# Patient Record
Sex: Female | Born: 1965
Health system: Southern US, Community
[De-identification: ages and names within clinical notes are randomized; demographics above are authoritative.]

## PROBLEM LIST (undated history)

## (undated) DIAGNOSIS — M79605 Pain in left leg: Secondary | ICD-10-CM

## (undated) DIAGNOSIS — K279 Peptic ulcer, site unspecified, unspecified as acute or chronic, without hemorrhage or perforation: Secondary | ICD-10-CM

## (undated) DIAGNOSIS — H539 Unspecified visual disturbance: Secondary | ICD-10-CM

## (undated) DIAGNOSIS — R030 Elevated blood-pressure reading, without diagnosis of hypertension: Secondary | ICD-10-CM

## (undated) DIAGNOSIS — D649 Anemia, unspecified: Secondary | ICD-10-CM

## (undated) DIAGNOSIS — E213 Hyperparathyroidism, unspecified: Secondary | ICD-10-CM

## (undated) DIAGNOSIS — I10 Essential (primary) hypertension: Secondary | ICD-10-CM

## (undated) DIAGNOSIS — F419 Anxiety disorder, unspecified: Secondary | ICD-10-CM

## (undated) DIAGNOSIS — T148XXA Other injury of unspecified body region, initial encounter: Secondary | ICD-10-CM

## (undated) DIAGNOSIS — M8580 Other specified disorders of bone density and structure, unspecified site: Secondary | ICD-10-CM

## (undated) HISTORY — DX: Essential (primary) hypertension: I10

## (undated) HISTORY — DX: Peptic ulcer, site unspecified, unspecified as acute or chronic, without hemorrhage or perforation: K27.9

## (undated) HISTORY — DX: Unspecified visual disturbance: H53.9

---

## 2006-02-15 DIAGNOSIS — I10 Essential (primary) hypertension: Secondary | ICD-10-CM

## 2006-02-15 HISTORY — DX: Essential (primary) hypertension: I10

## 2006-02-15 HISTORY — PX: GASTRIC BYPASS: SHX52

## 2016-01-11 ENCOUNTER — Encounter (HOSPITAL_COMMUNITY): Payer: Self-pay | Admitting: Emergency Medicine

## 2016-01-11 ENCOUNTER — Emergency Department (HOSPITAL_COMMUNITY): Payer: BLUE CROSS/BLUE SHIELD

## 2016-01-11 ENCOUNTER — Emergency Department (HOSPITAL_COMMUNITY)
Admission: EM | Admit: 2016-01-11 | Discharge: 2016-01-11 | Disposition: A | Discharge status: Home / Self Care | Payer: BLUE CROSS/BLUE SHIELD | Attending: Emergency Medicine | Admitting: Emergency Medicine

## 2016-01-11 DIAGNOSIS — S59901A Unspecified injury of right elbow, initial encounter: Secondary | ICD-10-CM | POA: Diagnosis present

## 2016-01-11 DIAGNOSIS — S8012XA Contusion of left lower leg, initial encounter: Secondary | ICD-10-CM | POA: Diagnosis not present

## 2016-01-11 DIAGNOSIS — R51 Headache: Secondary | ICD-10-CM | POA: Diagnosis not present

## 2016-01-11 DIAGNOSIS — Y9289 Other specified places as the place of occurrence of the external cause: Secondary | ICD-10-CM | POA: Insufficient documentation

## 2016-01-11 DIAGNOSIS — S8001XA Contusion of right knee, initial encounter: Secondary | ICD-10-CM | POA: Diagnosis not present

## 2016-01-11 DIAGNOSIS — S50311A Abrasion of right elbow, initial encounter: Secondary | ICD-10-CM | POA: Diagnosis not present

## 2016-01-11 DIAGNOSIS — Y9352 Activity, horseback riding: Secondary | ICD-10-CM | POA: Diagnosis not present

## 2016-01-11 DIAGNOSIS — Y999 Unspecified external cause status: Secondary | ICD-10-CM | POA: Insufficient documentation

## 2016-01-11 DIAGNOSIS — R52 Pain, unspecified: Secondary | ICD-10-CM

## 2016-01-11 DIAGNOSIS — T07XXXA Unspecified multiple injuries, initial encounter: Secondary | ICD-10-CM

## 2016-01-11 HISTORY — DX: Anxiety disorder, unspecified: F41.9

## 2016-01-11 LAB — COMPREHENSIVE METABOLIC PANEL
ALT: 21 U/L (ref 14–54)
ANION GAP: 7 (ref 5–15)
AST: 22 U/L (ref 15–41)
Albumin: 3.4 g/dL — ABNORMAL LOW (ref 3.5–5.0)
Alkaline Phosphatase: 100 U/L (ref 38–126)
BUN: 7 mg/dL (ref 6–20)
CO2: 21 mmol/L — AB (ref 22–32)
Calcium: 10.2 mg/dL (ref 8.9–10.3)
Chloride: 107 mmol/L (ref 101–111)
Creatinine, Ser: 0.64 mg/dL (ref 0.44–1.00)
GFR calc Af Amer: >60 mL/min (ref >60)
GFR calc non Af Amer: >60 mL/min (ref >60)
GLUCOSE: 97 mg/dL (ref 65–99)
POTASSIUM: 3.8 mmol/L (ref 3.5–5.1)
SODIUM: 135 mmol/L (ref 135–145)
Total Bilirubin: 0.2 mg/dL — ABNORMAL LOW (ref 0.3–1.2)
Total Protein: 6.5 g/dL (ref 6.5–8.1)

## 2016-01-11 LAB — CBC WITH DIFFERENTIAL/PLATELET
BASOS ABS: 0 10*3/uL (ref 0.0–0.1)
BASOS PCT: 0 %
EOS ABS: 0.1 10*3/uL (ref 0.0–0.7)
Eosinophils Relative: 1 %
HCT: 36.7 % (ref 36.0–46.0)
HEMOGLOBIN: 12 g/dL (ref 12.0–15.0)
Lymphocytes Relative: 33 %
Lymphs Abs: 1.6 10*3/uL (ref 0.7–4.0)
MCH: 27.3 pg (ref 26.0–34.0)
MCHC: 32.7 g/dL (ref 30.0–36.0)
MCV: 83.6 fL (ref 78.0–100.0)
MONO ABS: 0.5 10*3/uL (ref 0.1–1.0)
MONOS PCT: 11 %
NEUTROS PCT: 55 %
Neutro Abs: 2.7 10*3/uL (ref 1.7–7.7)
Platelets: 201 10*3/uL (ref 150–400)
RBC: 4.39 MIL/uL (ref 3.87–5.11)
RDW: 13.4 % (ref 11.5–15.5)
WBC: 4.9 10*3/uL (ref 4.0–10.5)

## 2016-01-11 LAB — I-STAT BETA HCG BLOOD, ED (MC, WL, AP ONLY): I-stat hCG, quantitative: <5 m[IU]/mL (ref <5)

## 2016-01-11 MED ORDER — ONDANSETRON 4 MG PO TBDP
4.0000 mg | ORAL_TABLET | Freq: Once | ORAL | Status: AC
  Administered 2016-01-11: 4 mg via ORAL
  Filled 2016-01-11: qty 1

## 2016-01-11 MED ORDER — ONDANSETRON 4 MG PO TBDP: 4.0000 mg | ORAL_TABLET | Freq: Three times a day (TID) | ORAL | 0 refills | Status: DC | PRN

## 2016-01-11 MED ORDER — HYDROMORPHONE HCL 2 MG/ML IJ SOLN
1.0000 mg | Freq: Once | INTRAMUSCULAR | Status: AC
  Administered 2016-01-11: 1 mg via INTRAVENOUS
  Filled 2016-01-11: qty 1

## 2016-01-11 MED ORDER — OXYCODONE-ACETAMINOPHEN 5-325 MG PO TABS: 1.0000 | ORAL_TABLET | ORAL | 0 refills | Status: DC | PRN

## 2016-01-11 NOTE — Discharge Instructions (Signed)
Take the prescribed medication as directed.  Do not drive while taking this, it can make you sleepy/drowsy. You may be sore for a while with your injuries.  Can wear the sling to help with right elbow pain. Make sure to follow-up with your neurosurgeon about your brain cyst.   Follow-up with your primary care doctor. Return to the ED for new or worsening symptoms.

## 2016-01-11 NOTE — ED Notes (Signed)
Pt to radiology.

## 2016-01-11 NOTE — ED Provider Notes (Signed)
[facility] DEPT Provider Note   CSN: [id number] Arrival date & time: 01/11/16  1209     History   Chief Complaint Chief Complaint  Patient presents with  . Fall    HPI Cynthia Hoover is a 50 y.o. female.  The history is provided by the patient and medical records.   50 year old female with history of anxiety, brain cyst under close surveillance, presenting to the ED after a fall from a horse yesterday. Patient was unhelmeted rider of an 50 year old course going [place] a horse Trail. States she was approached rapidly by a biker who spooked horse who bucked her off.  She landed on her right side.  She does not recall hitting her head, unsure of LOC though.  She states horse ran over the top of her but did not step on her directly.  Patient had worsening pain throughout the night and decided to come in today.  She has pain along the back of her head, neck, right elbow and shoulder, right hip, and left shin. She has multiple areas of bruising and abrasions. Patient does report being bucked off of a horse several years ago and fractured her lumbar spine. States this healed well, she did wear a back brace for a few months. She denies any current back pain. No numbness or weakness of her extremities. No bowel or bladder incontinence. Patient has been taking over-the-counter pain medication at home without significant improvement.  She denies any chest pain or shortness of breath. No abdominal pain.  Past Medical History:  Diagnosis Date  . Anxiety   . Cyst of brain     There are no active problems to display for this patient.   History reviewed. No pertinent surgical history.  OB History    No data available       Home Medications    Prior to Admission medications   Not on File    Family History No family history on file.  Social History Social History  Substance Use Topics  . Smoking status: Never Smoker  . Smokeless tobacco: Not on file  . Alcohol use No      Allergies   Patient has no known allergies.   Review of Systems Review of Systems  Musculoskeletal: Positive for arthralgias.  All other systems reviewed and are negative.    Physical Exam Updated Vital Signs BP 161/91 (BP Location: Left Arm)   Pulse 71   Temp 98.3 F (36.8 C) (Oral)   Resp 16   LMP  (LMP Unknown)   SpO2 99%   Physical Exam  Constitutional: She is oriented to person, place, and time. She appears well-developed and well-nourished.  Appears uncomfortable  HENT:  Head: Normocephalic and atraumatic.  Mouth/Throat: Oropharynx is clear and moist.  Tenderness noted of the occipital scalp without significant hematoma or laceration; face is atraumatic and stable, dentition intact, oropharynx is clear  Eyes: Conjunctivae and EOM are normal. Pupils are equal, round, and reactive to light.  Pupils symmetric and reactive bilaterally  Neck:  C-collar in place  Cardiovascular: Normal rate, regular rhythm and normal heart sounds.   Pulmonary/Chest: Effort normal and breath sounds normal.  No bruising or deformities of the chest wall  Abdominal: Soft. Bowel sounds are normal. There is no tenderness. There is no rigidity.  No bruising of the abdominal wall, no tenderness  Musculoskeletal: Normal range of motion.  Bruising noted to left shin, right knee, right elbow; tenderness of all of these areas as well as right  hip; right elbow is held in flexed position and appears to have a deformity/swelling along the medical epicondyle and large skin abrasion overlying; no visible bone or skin tenting pelvis feels stable, no leg shortening; extremity pulses intact x4 Thoracic and lumbar spine non-tender; no step-off  Neurological: She is alert and oriented to person, place, and time.  AAOx3, answering questions and following commands appropriately; equal strength UE and LE bilaterally; CN grossly intact; limited movement of right arm due to pain, moving all other extremities  well; no focal neuro deficits or facial asymmetry appreciated  Skin: Skin is warm and dry.  Psychiatric: She has a normal mood and affect.  Nursing note and vitals reviewed.    ED Treatments / Results  Labs (all labs ordered are listed, but only abnormal results are displayed) Labs Reviewed  COMPREHENSIVE METABOLIC PANEL - Abnormal; Notable for the following:       Result Value   CO2 21 (*)    Albumin 3.4 (*)    Total Bilirubin 0.2 (*)    All other components within normal limits  CBC WITH DIFFERENTIAL/PLATELET  I-STAT BETA HCG BLOOD, ED ([facility], [facility], [facility] ONLY)    EKG  EKG Interpretation None       Radiology Dg Chest 1 View  Result Date: 01/11/2016 CLINICAL DATA:  Fall from horse yesterday. EXAM: CHEST 1 VIEW COMPARISON:  None. FINDINGS: Heart size and mediastinal contours are within normal limits given the supine patient positioning an AP projection. Lungs are clear. Lung volumes are normal. No pleural effusion or pneumothorax seen. Osseous structures about the chest are unremarkable. IMPRESSION: No active disease. Electronically Signed   By: Franki Cabot M.D.   On: 01/11/2016 14:31   Dg Shoulder Right  Result Date: 01/11/2016 CLINICAL DATA:  Pt reports falling from her horse yesterday, not wearing a helmet. Pt c/o posterior right shoulder pain. EXAM: RIGHT SHOULDER - 2+ VIEW COMPARISON:  None. FINDINGS: There is no evidence of fracture or dislocation. There is no evidence of arthropathy or other focal bone abnormality. Soft tissues are unremarkable. IMPRESSION: Negative. Electronically Signed   By: Franki Cabot M.D.   On: 01/11/2016 14:28   Dg Elbow Complete Right  Result Date: 01/11/2016 CLINICAL DATA:  Pt reports falling from her horse yesterday, not wearing a helmet. Pt has widespread hematoma to medial and anterior right elbow with an abrasion to same area. EXAM: RIGHT ELBOW - COMPLETE 3+ VIEW COMPARISON:  None. FINDINGS: There is no evidence of fracture, dislocation, or  joint effusion. There is no evidence of arthropathy or other focal bone abnormality. Soft tissues are unremarkable. IMPRESSION: Negative. Electronically Signed   By: Franki Cabot M.D.   On: 01/11/2016 14:25   Dg Tibia/fibula Left  Result Date: 01/11/2016 CLINICAL DATA:  Pt reports falling from her horse yesterday, not wearing a helmet. Pt c/o general left tib/fib pain while walking and medial bruising. EXAM: LEFT TIBIA AND FIBULA - 2 VIEW COMPARISON:  None. FINDINGS: There is no evidence of fracture or other focal bone lesions. Soft tissues are unremarkable. IMPRESSION: Negative. Electronically Signed   By: Franki Cabot M.D.   On: 01/11/2016 14:28   Ct Head Wo Contrast  Result Date: 01/11/2016 CLINICAL DATA:  Patient status post fall from horse. No reported loss consciousness. Head and neck pain. EXAM: CT HEAD WITHOUT CONTRAST CT CERVICAL SPINE WITHOUT CONTRAST TECHNIQUE: Multidetector CT imaging of the head and cervical spine was performed following the standard protocol without intravenous contrast. Multiplanar CT image  reconstructions of the cervical spine were also generated. COMPARISON:  None. FINDINGS: CT HEAD FINDINGS Brain: Ventricles and sulci are appropriate for patient's age. No evidence for acute cortically based infarct, intracranial hemorrhage, mass lesion or mass-effect. There is a 7 mm mass within the expected location of the third ventricle with increased central density, suggestive of a colloid cyst. Vascular: Unremarkable Skull: Intact. Sinuses/Orbits: Paranasal sinuses are well aerated. Mastoid air cells unremarkable. Orbits are unremarkable. Other: None. CT CERVICAL SPINE FINDINGS Alignment: Normal anatomic alignment. Skull base and vertebrae: No acute fracture. No primary bone lesion or focal pathologic process. Soft tissues and spinal canal: No prevertebral fluid or swelling. No visible canal hematoma. Disc levels: Multilevel degenerative disc disease most pronounced C5-6. No  evidence for acute cervical spine fracture. Upper chest: Not well visualized. Other: None. IMPRESSION: No acute intracranial process. 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 Recommend outpatient neuro surgical follow-up. No acute cervical spine fracture. Electronically Signed   By: Lovey Newcomer M.D.   On: 01/11/2016 13:29   Ct Cervical Spine Wo Contrast  Result Date: 01/11/2016 CLINICAL DATA:  Patient status post fall from horse. No reported loss consciousness. Head and neck pain. EXAM: CT HEAD WITHOUT CONTRAST CT CERVICAL SPINE WITHOUT CONTRAST TECHNIQUE: Multidetector CT imaging of the head and cervical spine was performed following the standard protocol without intravenous contrast. Multiplanar CT image reconstructions of the cervical spine were also generated. COMPARISON:  None. FINDINGS: CT HEAD FINDINGS Brain: Ventricles and sulci are appropriate for patient's age. No evidence for acute cortically based infarct, intracranial hemorrhage, mass lesion or mass-effect. There is a 7 mm mass within the expected location of the third ventricle with increased central density, suggestive of a colloid cyst. Vascular: Unremarkable Skull: Intact. Sinuses/Orbits: Paranasal sinuses are well aerated. Mastoid air cells unremarkable. Orbits are unremarkable. Other: None. CT CERVICAL SPINE FINDINGS Alignment: Normal anatomic alignment. Skull base and vertebrae: No acute fracture. No primary bone lesion or focal pathologic process. Soft tissues and spinal canal: No prevertebral fluid or swelling. No visible canal hematoma. Disc levels: Multilevel degenerative disc disease most pronounced C5-6. No evidence for acute cervical spine fracture. Upper chest: Not well visualized. Other: None. IMPRESSION: No acute intracranial process. 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 Recommend outpatient neuro surgical follow-up. No acute cervical spine fracture. Electronically Signed   By: Lovey Newcomer M.D.   On:  01/11/2016 13:29   Dg Knee Complete 4 Views Right  Result Date: 01/11/2016 CLINICAL DATA:  Pt reports falling from her horse yesterday, not wearing a helmet. Swelling and bruising to right anterior knee. EXAM: RIGHT KNEE - COMPLETE 4+ VIEW COMPARISON:  None. FINDINGS: No evidence of fracture, dislocation, or joint effusion. No evidence of arthropathy or other focal bone abnormality. Soft tissues are unremarkable. IMPRESSION: Negative. Electronically Signed   By: Franki Cabot M.D.   On: 01/11/2016 14:27   Dg Hip Unilat W Or Wo Pelvis 2-3 Views Right  Result Date: 01/11/2016 CLINICAL DATA:  Pt reports falling from her horse yesterday, not wearing a helmet. Pt c/o posterior right hip pain. EXAM: DG HIP (WITH OR WITHOUT PELVIS) 2-3V RIGHT COMPARISON:  None. FINDINGS: Single view of the pelvis and two views of the right hip are provided. Osseous alignment is normal. No fracture line or displaced fracture fragment identified. Right femoral head is normally positioned relative to the acetabulum. Soft tissues about the pelvis and right hip are unremarkable. IMPRESSION: Negative. Electronically Signed   By: Cherlynn Kaiser  Enriqueta Shutter M.D.   On: 01/11/2016 14:26    Procedures Procedures (including critical care time)  Medications Ordered in ED Medications  HYDROmorphone (DILAUDID) injection 1 mg (1 mg Intravenous Given 01/11/16 1244)  HYDROmorphone (DILAUDID) injection 1 mg (1 mg Intravenous Given 01/11/16 1449)  ondansetron (ZOFRAN-ODT) disintegrating tablet 4 mg (4 mg Oral Given 01/11/16 1521)     Initial Impression / Assessment and Plan / ED Course  I have reviewed the triage vital signs and the nursing notes.  Pertinent labs & imaging results that were available during my care of the patient were reviewed by me and considered in my medical decision making (see chart for details).  Clinical Course    50 y.o. F here after getting bucked off horse yesterday.  Non-helmeted, no head injury but unsure of LOC.   She is AAOx3 here.  Exam with tenderness to left shin, right knee, right hip, right elbow, right shoulder, neck, and posterior scalp.  She is ambulatory but with some pain.  Imaging obtained-- all of which is negative for acute injuries.  There is note of brain cyst which is known to the patient, she already follows with neurology for this.  Patient remains AAOx3.  c-collar removed, able to range her neck without difficulty.  Patient was provided with sling to help with elbow/arm pain.  Discharge home with pain meds given her multiple areas of injury.  Recommended to follow-up with PCP.  Discussed plan with patient, he/she acknowledged understanding and agreed with plan of care.  Return precautions given for new or worsening symptoms.  Of note, patient did have episode of emesis prior to leaving ED.  Continues to deny abdominal pain.  She did receive 2 doses of IV pain medication but has not eaten today.  Suspect this may be the case.  She was given dose of zofran here, able to drink fluids.  Given zofran script to take with her pain medications at home.  Final Clinical Impressions(s) / ED Diagnoses   Final diagnoses:  Fall from horse, initial encounter  Pain of multiple sites  Abrasions of multiple sites    New Prescriptions Discharge Medication List as of 01/11/2016  2:56 PM    START taking these medications   Details  oxyCODONE-acetaminophen (PERCOCET/ROXICET) 5-325 MG tablet Take 1 tablet by mouth every 4 (four) hours as needed., Starting Sun 01/11/2016, Print         Larene Pickett, PA-C 01/11/16 [place], MD 01/11/16 1600

## 2016-01-11 NOTE — ED Notes (Signed)
Pt to xray

## 2016-01-11 NOTE — ED Notes (Signed)
Pt returned from xray

## 2016-01-11 NOTE — ED Triage Notes (Signed)
Per [facility], pt riding horse, horse got spooked and threw her off. Pt landed on her right side. C/o neck pain, R elbow pain, R hip pain. Obvious deformity to R elbow. Pt hit her head, was not wearing helmet. Unsure of LOC. No dizziness, N/v, headaches etc. Pt in NAD, AAOx4. C collar in place. Bruising and abrasion to R elbow. Bruising to R hip.

## 2016-01-11 NOTE — ED Notes (Signed)
Pt returned from CT °

## 2016-01-30 ENCOUNTER — Ambulatory Visit (INDEPENDENT_AMBULATORY_CARE_PROVIDER_SITE_OTHER): Payer: Self-pay | Admitting: Family Medicine

## 2016-01-30 ENCOUNTER — Encounter: Payer: Self-pay | Admitting: Family Medicine

## 2016-01-30 ENCOUNTER — Other Ambulatory Visit: Payer: Self-pay | Admitting: Family Medicine

## 2016-01-30 VITALS — BP 120/78 | HR 78 | Temp 98.1°F | Ht 69.0 in | Wt 216.0 lb

## 2016-01-30 DIAGNOSIS — F419 Anxiety disorder, unspecified: Secondary | ICD-10-CM

## 2016-01-30 DIAGNOSIS — J0100 Acute maxillary sinusitis, unspecified: Secondary | ICD-10-CM

## 2016-01-30 DIAGNOSIS — S40811D Abrasion of right upper arm, subsequent encounter: Secondary | ICD-10-CM

## 2016-01-30 DIAGNOSIS — J40 Bronchitis, not specified as acute or chronic: Secondary | ICD-10-CM

## 2016-01-30 MED ORDER — DESVENLAFAXINE SUCCINATE ER 50 MG PO TB24: 50.0000 mg | ORAL_TABLET | Freq: Every day | ORAL | 5 refills | Status: DC

## 2016-01-30 MED ORDER — AMOXICILLIN-POT CLAVULANATE 875-125 MG PO TABS: 1.0000 | ORAL_TABLET | Freq: Two times a day (BID) | ORAL | 0 refills | Status: DC

## 2016-01-30 NOTE — Patient Instructions (Signed)
Deep Skin Avulsion Introduction A deep skin avulsion is a type of open wound. It often results from a severe injury (trauma) that tears away all layers of the skin or an entire body part. The areas of the body that are most often affected by a deep skin avulsion include the face, lips, ears, nose, and fingers. A deep skin avulsion may make structures below the skin become visible. You may be able to see muscle, bone, nerves, and blood vessels. A deep skin avulsion can also damage important structures beneath the skin. These include tendons, ligaments, nerves, or blood vessels. What are the causes? Injuries that often cause a deep skin avulsion include:  Being crushed.  Falling against a jagged surface.  Animal bites.  Gunshot wounds.  Severe burns.  Injuries that involve being dragged, such as bicycle or motorcycle accidents. What are the signs or symptoms? Symptoms of a deep skin avulsion include:  Pain.  Numbness.  Swelling.  A misshapen body part.  Bleeding, which may be heavy.  Fluid leaking from the wound. How is this diagnosed? This condition may be diagnosed with a medical history and physical exam. You may also have X-rays done. How is this treated? The treatment that is chosen for a deep skin avulsion depends on how large and deep the wound is and where it is located. Treatment for all types of avulsions usually starts with:  Controlling the bleeding.  Washing out the wound with a germ-free (sterile) salt-water solution.  Removing dead tissue from the wound. A wound may be closed or left open to heal. This depends on the size and location of the wound and whether it is likely to become infected. Wounds are usually covered or closed if they expose blood vessels, nerves, bone, or cartilage.  Wounds that are small and clean may be closed with stitches (sutures).  Wounds that cannot be closed with sutures may be covered with a piece of skin (graft) or a skin flap.  Skin may be taken from on or near the wound, from another part of the body, or from a donor.  Wounds may be left open if they are hard to close or they may become infected. These wounds heal over time from the bottom up. You may also receive medicine. This may include:  Antibiotics.  A tetanus shot.  Rabies vaccine. Follow these instructions at home: Medicines  Take or apply over-the-counter and prescription medicines only as told by your health care provider.  If you were prescribed an antibiotic, take or apply it as told by your health care provider. Do not stop taking the antibiotic even if your condition improves.  You may get anti-itch medicine while your wound is healing. Use it only as told by your health care provider. Wound care  There are many ways to close and cover a wound. For example, a wound can be covered with sutures, skin glue, or adhesive strips. Follow instructions from your health care provider about:  How to take care of your wound.  When and how you should change your bandage (dressing).  When you should remove your dressing.  Removing whatever was used to close your wound.  Keep the dressing dry as told by your health care provider. Do not take baths, swim, use a hot tub, or do anything that would put your wound underwater until your health care provider approves.  Clean the wound each day or as told by your health care provider.  Wash the wound with mild  soap and water.  Rinse the wound with water to remove all soap.  Pat the wound dry with a clean towel. Do not rub it.  Do not scratch or pick at the wound.  Check your wound every day for signs of infection. Watch for:  Redness, swelling, or pain.  Fluid, blood, or pus. General instructions  Raise (elevate) the injured area above the level of your heart while you are sitting or lying down.  Keep all follow-up visits as told by your health care provider. This is important. Contact a health  care provider if:  You received a tetanus shot and you have swelling, severe pain, redness, or bleeding at the injection site.  You have a fever.  Your pain is not controlled with medicine.  You have increased redness, swelling, or pain at the site of your wound.  You have fluid, blood, or pus coming from your wound.  You notice a bad smell coming from your wound or your dressing.  A wound that was closed breaks open.  You notice something coming out of the wound, such as wood or glass.  You notice a change in the color of your skin near your wound.  You develop a new rash.  You need to change the dressing frequently due to fluid, blood, or pus draining from the wound. Get help right away if:  Your pain suddenly increases and is severe.  You develop severe swelling around the wound.  You develop numbness around the wound.  You have nausea and vomiting that does not go away after 24 hours.  You feel light-headed, weak, or faint.  You develop chest pain.  You have trouble breathing.  Your wound is on your hand or foot and you cannot properly move a finger or toe.  The wound is on your hand or foot and you notice that your fingers or toes look pale or bluish.  You have a red streak going away from your wound. This information is not intended to replace advice given to you by your health care provider. Make sure you discuss any questions you have with your health care provider. Document Released: 03/30/2006 Document Revised: 07/10/2015 Document Reviewed: 02/06/2014  2017 [employer]

## 2016-01-30 NOTE — Progress Notes (Signed)
Name: Cynthia Hoover   MRN: [id number]    DOB: [birth year]   Date:01/30/2016       Progress Note  Subjective  Chief Complaint  Chief Complaint  Patient presents with  . Establish Care  . Sinusitis    cough and cong- dry cough- has to do R) side when coughing    Sinusitis  This is a new problem. The current episode started in the past 7 days. The problem has been gradually worsening since onset. There has been no fever. The fever has been present for 1 to 2 days. Her pain is at a severity of 3/10. The pain is mild. Associated symptoms include congestion, coughing and shortness of breath. Pertinent negatives include no chills, diaphoresis, ear pain, headaches, hoarse voice, neck pain, sinus pressure, sneezing, sore throat or swollen glands. Past treatments include acetaminophen (aleve). The treatment provided mild relief.    No problem-specific Assessment & Plan notes found for this encounter.   Past Medical History:  Diagnosis Date  . Anxiety   . Cyst of brain     History reviewed. No pertinent surgical history.  History reviewed. No pertinent family history.  Social History   Social History  . Marital status: Married    Spouse name: N/A  . Number of children: N/A  . Years of education: N/A   Occupational History  . Not on file.   Social History Main Topics  . Smoking status: Never Smoker  . Smokeless tobacco: Never Used  . Alcohol use No  . Drug use: No  . Sexual activity: Yes   Other Topics Concern  . Not on file   Social History Narrative  . No narrative on file    No Known Allergies   Review of Systems  Constitutional: Negative for chills, diaphoresis, fever, malaise/fatigue and weight loss.  HENT: Positive for congestion. Negative for ear discharge, ear pain, hoarse voice, sinus pain, sinus pressure, sneezing and sore throat.   Eyes: Negative for blurred vision.  Respiratory: Positive for cough and shortness of breath. Negative for sputum  production and wheezing.   Cardiovascular: Negative for chest pain, palpitations and leg swelling.  Gastrointestinal: Negative for abdominal pain, blood in stool, constipation, diarrhea, heartburn, melena and nausea.  Genitourinary: Negative for dysuria, frequency, hematuria and urgency.  Musculoskeletal: Negative for back pain, joint pain, myalgias and neck pain.  Skin: Negative for rash.  Neurological: Negative for dizziness, tingling, sensory change, focal weakness and headaches.  Endo/Heme/Allergies: Negative for environmental allergies and polydipsia. Does not bruise/bleed easily.  Psychiatric/Behavioral: Negative for depression and suicidal ideas. The patient is not nervous/anxious and does not have insomnia.      Objective  Vitals:   01/30/16 1413  BP: 120/78  Pulse: 78  Temp: 98.1 F (36.7 C)  TempSrc: Oral  SpO2: 99%  Weight: 216 lb (98 kg)  Height: 5\' 9"  (1.753 m)    Physical Exam  Constitutional: She is well-developed, well-nourished, and in no distress. No distress.  HENT:  Head: Normocephalic and atraumatic.  Right Ear: External ear normal.  Left Ear: External ear normal.  Nose: Nose normal.  Mouth/Throat: Oropharynx is clear and moist.  Eyes: Conjunctivae and EOM are normal. Pupils are equal, round, and reactive to light. Right eye exhibits no discharge. Left eye exhibits no discharge.  Neck: Normal range of motion. Neck supple. No JVD present. No thyromegaly present.  Cardiovascular: Normal rate, regular rhythm, normal heart sounds and intact distal pulses.  Exam reveals no gallop and no friction  rub.   No murmur heard. Pulmonary/Chest: Effort normal and breath sounds normal. She has no wheezes. She has no rales.  Abdominal: Soft. Bowel sounds are normal. She exhibits no mass. There is no tenderness. There is no guarding.  Musculoskeletal: Normal range of motion. She exhibits no edema.  Lymphadenopathy:    She has no cervical adenopathy.  Neurological: She is  alert.  Skin: Skin is warm and dry. Abrasion noted. She is not diaphoretic. There is erythema.  Quarter inch depth  Psychiatric: Mood and affect normal.  Nursing note and vitals reviewed.     Assessment & Plan  Problem List Items Addressed This Visit    None    Visit Diagnoses    Chronic anxiety    -  Primary   Relevant Medications   desvenlafaxine (PRISTIQ) 50 MG 24 hr tablet   Abrasion of right upper arm, subsequent encounter       Relevant Orders   Ambulatory referral to General Surgery   Wound culture   Acute non-recurrent maxillary sinusitis       Relevant Medications   amoxicillin-clavulanate (AUGMENTIN) 875-125 MG tablet   Bronchitis       mucinex dm        Dr. Deanna Jones [facility]  01/30/16

## 2016-02-03 LAB — WOUND CULTURE: ORGANISM ID, BACTERIA: NONE SEEN

## 2016-02-04 ENCOUNTER — Other Ambulatory Visit: Payer: Self-pay

## 2016-02-05 ENCOUNTER — Encounter: Payer: Self-pay | Admitting: Surgery

## 2016-02-05 ENCOUNTER — Ambulatory Visit (INDEPENDENT_AMBULATORY_CARE_PROVIDER_SITE_OTHER): Payer: BLUE CROSS/BLUE SHIELD | Admitting: Surgery

## 2016-02-05 VITALS — BP 151/103 | HR 80 | Temp 98.2°F | Ht 69.0 in | Wt 218.0 lb

## 2016-02-05 DIAGNOSIS — S51001A Unspecified open wound of right elbow, initial encounter: Secondary | ICD-10-CM

## 2016-02-05 MED ORDER — COLLAGENASE 250 UNIT/GM EX OINT: TOPICAL_OINTMENT | Freq: Every day | CUTANEOUS | Status: AC

## 2016-02-05 NOTE — Progress Notes (Signed)
02/05/2016  Reason for Visit:  Open right elbow wound  History of Present Illness: Cynthia Hoover is a 50 y.o. female who fell off a horse on 11/25. She went to the emergency room at [facility] 11/26 where she had a CT of the head and cervical spine as well as x-rays of the chest left lower extremity, right hip and knee, and right upper extremity. All imaging studies were negative she was discharged to home. She developed significant abrasion type of wound proximal to the right elbow the posterior aspect of the arm with significant ecchymosis. She does report that she had significant bloody drainage from the open wound at home at one point but otherwise the wound has continued to heal. She was seen by her PCP who recommended surgical evaluation of her wound.  Otherwise the patient reports that her wound has been actually healing appropriately and has been decreasing in size. She denies any significant pain over the elbow with only some discomfort particularly at the wound itself. She reports that the swelling around the wound has improved significantly and there is no further ecchymosis or drainage. She has been applying Neosporin and a bandage to cover on a daily basis. She denies any range of motion difficulties or neurologic deficits.  Past Medical History: Past Medical History:  Diagnosis Date  . Anxiety   . Cyst of brain      Past Surgical History: Past Surgical History:  Procedure Laterality Date  . CESAREAN SECTION  1986    Home Medications: Prior to Admission medications   Medication Sig Start Date End Date Taking? Authorizing Provider  amoxicillin-clavulanate (AUGMENTIN) 875-125 MG tablet Take 1 tablet by mouth 2 (two) times daily. 01/30/16  Yes Juline Patch, MD  desvenlafaxine (PRISTIQ) 50 MG 24 hr tablet Take 1 tablet (50 mg total) by mouth daily. 01/30/16  Yes Juline Patch, MD  ibuprofen (ADVIL,MOTRIN) 200 MG tablet Take 400 mg by mouth every 6 (six) hours as needed  for moderate pain.   Yes Historical Provider, MD    Allergies: No Known Allergies  Social History:  reports that she has never smoked. She has never used smokeless tobacco. She reports that she does not drink alcohol or use drugs.   Family History: Family History  Problem Relation Age of Onset  . Adopted: Yes    Review of Systems: Review of Systems  Constitutional: Negative for chills and fever.  HENT: Negative for hearing loss.   Eyes: Negative for blurred vision.  Respiratory: Negative for cough and shortness of breath.   Cardiovascular: Negative for chest pain and leg swelling.  Gastrointestinal: Negative for abdominal pain, heartburn, nausea and vomiting.  Genitourinary: Negative for dysuria.  Musculoskeletal: Negative for joint pain, myalgias and neck pain.  Skin: Negative for rash.       Right elbow wound, improving  Neurological: Negative for dizziness.  Psychiatric/Behavioral: Negative for depression.    Physical Exam BP (!) 151/103   Pulse 80   Temp 98.2 F (36.8 C) (Oral)   Ht 5\' 9"  (1.753 m)   Wt 98.9 kg (218 lb)   LMP  (LMP Unknown) Comment: NEG preg test on 01/11/16  BMI 32.19 kg/m  CONSTITUTIONAL: No acute distress HEENT:  Normocephalic, atraumatic, extraocular motion intact. NECK: Trachea is midline, and there is no jugular venous distension.  RESPIRATORY:  No respiratory distress and lungs are clear bilaterally. CARDIOVASCULAR: Heart is regular without murmurs, gallops, or rubs. GI: The abdomen is soft, nondistended, nontender. MUSCULOSKELETAL:  Normal muscle strength and tone in all four extremities.  Normal range of motion over the right elbow joint. No peripheral edema or cyanosis. SKIN: On the posterior aspect of the right arm just proximal to the right elbow there is a wound measuring approximately 2.5 cm x 1.5 cm. No subcutaneous tissue is exposed. The edges are clean with no evidence of infection. There is mild induration around the wound  consistent with the patient's previous hematoma. The wound base appears healthy NEUROLOGIC:  Motor and sensation is grossly normal.  Cranial nerves are grossly intact. PSYCH:  Alert and oriented to person, place and time. Affect is normal.  Laboratory Analysis: No results found for this or any previous visit (from the past 24 hour(s)).  Imaging: No results found.  Assessment and Plan: This is a 50 y.o. female who presents with an open right upper extremity wound as a result from a fall off a horse last month. This wound is healing well.  -Currently there are no surgical interventions needed for this wound. Have recommended the patient start applying Santyl ointment to the wound to clean up the edges and the wound base better so it continues to heal appropriately. Otherwise no antibiotics are needed for this and no debridement is needed for this at this time. -Have explained to the patient that the wound should continue to heal well but if there are any issues she may require referral to the wound care center for further management. If the wound does not heal appropriately there may be a need for surgical intervention but this is not common. The patient understands this and all of her questions have been answered. -No restrictions to her activities at this point as long as she protects the wound with a bandage so it stays clean and dry -She may follow-up with [place] on an as-needed basis   Melvyn Neth, [place]

## 2016-02-05 NOTE — Patient Instructions (Addendum)
Please call our office if you have any questions or concerns. Please pick up your medicine today at your pharmacy.

## 2017-02-18 DIAGNOSIS — J019 Acute sinusitis, unspecified: Secondary | ICD-10-CM | POA: Diagnosis not present

## 2017-02-18 DIAGNOSIS — J209 Acute bronchitis, unspecified: Secondary | ICD-10-CM | POA: Diagnosis not present

## 2017-05-17 DIAGNOSIS — G8929 Other chronic pain: Secondary | ICD-10-CM | POA: Diagnosis not present

## 2017-05-17 DIAGNOSIS — M25511 Pain in right shoulder: Secondary | ICD-10-CM | POA: Diagnosis not present

## 2017-05-17 DIAGNOSIS — S46911A Strain of unspecified muscle, fascia and tendon at shoulder and upper arm level, right arm, initial encounter: Secondary | ICD-10-CM | POA: Diagnosis not present

## 2017-05-20 DIAGNOSIS — Z719 Counseling, unspecified: Secondary | ICD-10-CM | POA: Diagnosis not present

## 2017-06-06 DIAGNOSIS — Z719 Counseling, unspecified: Secondary | ICD-10-CM | POA: Diagnosis not present

## 2017-06-13 DIAGNOSIS — Z719 Counseling, unspecified: Secondary | ICD-10-CM | POA: Diagnosis not present

## 2017-06-15 DIAGNOSIS — M542 Cervicalgia: Secondary | ICD-10-CM | POA: Diagnosis not present

## 2017-06-15 DIAGNOSIS — G8929 Other chronic pain: Secondary | ICD-10-CM | POA: Diagnosis not present

## 2017-06-20 DIAGNOSIS — Z719 Counseling, unspecified: Secondary | ICD-10-CM | POA: Diagnosis not present

## 2017-06-23 ENCOUNTER — Ambulatory Visit: Payer: BLUE CROSS/BLUE SHIELD | Admitting: Family Medicine

## 2017-06-30 DIAGNOSIS — Z719 Counseling, unspecified: Secondary | ICD-10-CM | POA: Diagnosis not present

## 2017-07-04 DIAGNOSIS — Z719 Counseling, unspecified: Secondary | ICD-10-CM | POA: Diagnosis not present

## 2017-07-12 DIAGNOSIS — Z719 Counseling, unspecified: Secondary | ICD-10-CM | POA: Diagnosis not present

## 2017-07-18 DIAGNOSIS — Z719 Counseling, unspecified: Secondary | ICD-10-CM | POA: Diagnosis not present

## 2017-07-25 DIAGNOSIS — Z719 Counseling, unspecified: Secondary | ICD-10-CM | POA: Diagnosis not present

## 2017-08-02 DIAGNOSIS — Z9884 Bariatric surgery status: Secondary | ICD-10-CM | POA: Diagnosis not present

## 2017-08-02 DIAGNOSIS — R5383 Other fatigue: Secondary | ICD-10-CM | POA: Diagnosis not present

## 2017-08-04 DIAGNOSIS — Z719 Counseling, unspecified: Secondary | ICD-10-CM | POA: Diagnosis not present

## 2017-08-08 DIAGNOSIS — Z719 Counseling, unspecified: Secondary | ICD-10-CM | POA: Diagnosis not present

## 2017-08-19 DIAGNOSIS — Z719 Counseling, unspecified: Secondary | ICD-10-CM | POA: Diagnosis not present

## 2017-08-24 DIAGNOSIS — Z719 Counseling, unspecified: Secondary | ICD-10-CM | POA: Diagnosis not present

## 2017-09-02 DIAGNOSIS — Z1159 Encounter for screening for other viral diseases: Secondary | ICD-10-CM | POA: Diagnosis not present

## 2017-09-02 DIAGNOSIS — Z13220 Encounter for screening for lipoid disorders: Secondary | ICD-10-CM | POA: Diagnosis not present

## 2017-09-02 DIAGNOSIS — R9089 Other abnormal findings on diagnostic imaging of central nervous system: Secondary | ICD-10-CM | POA: Diagnosis not present

## 2017-09-02 DIAGNOSIS — E559 Vitamin D deficiency, unspecified: Secondary | ICD-10-CM | POA: Diagnosis not present

## 2017-09-02 DIAGNOSIS — D649 Anemia, unspecified: Secondary | ICD-10-CM | POA: Diagnosis not present

## 2017-09-02 DIAGNOSIS — E041 Nontoxic single thyroid nodule: Secondary | ICD-10-CM | POA: Diagnosis not present

## 2017-09-06 ENCOUNTER — Other Ambulatory Visit: Payer: Self-pay | Admitting: Family Medicine

## 2017-09-06 DIAGNOSIS — E041 Nontoxic single thyroid nodule: Secondary | ICD-10-CM

## 2017-09-16 ENCOUNTER — Ambulatory Visit
Admission: RE | Admit: 2017-09-16 | Discharge: 2017-09-16 | Disposition: A | Discharge status: Home / Self Care | Payer: 59 | Source: Ambulatory Visit | Attending: Family Medicine | Admitting: Family Medicine

## 2017-09-16 DIAGNOSIS — E041 Nontoxic single thyroid nodule: Secondary | ICD-10-CM

## 2017-10-16 ENCOUNTER — Emergency Department (HOSPITAL_COMMUNITY): Payer: 59

## 2017-10-16 ENCOUNTER — Observation Stay (HOSPITAL_COMMUNITY)
Admission: EM | Admit: 2017-10-16 | Discharge: 2017-10-17 | Disposition: A | Discharge status: Home / Self Care | Payer: 59 | Attending: Emergency Medicine | Admitting: Emergency Medicine

## 2017-10-16 ENCOUNTER — Encounter (HOSPITAL_COMMUNITY): Payer: Self-pay | Admitting: Emergency Medicine

## 2017-10-16 DIAGNOSIS — Z79899 Other long term (current) drug therapy: Secondary | ICD-10-CM | POA: Diagnosis not present

## 2017-10-16 DIAGNOSIS — M549 Dorsalgia, unspecified: Secondary | ICD-10-CM | POA: Diagnosis not present

## 2017-10-16 DIAGNOSIS — R0781 Pleurodynia: Secondary | ICD-10-CM | POA: Diagnosis not present

## 2017-10-16 DIAGNOSIS — K289 Gastrojejunal ulcer, unspecified as acute or chronic, without hemorrhage or perforation: Secondary | ICD-10-CM | POA: Diagnosis present

## 2017-10-16 DIAGNOSIS — K279 Peptic ulcer, site unspecified, unspecified as acute or chronic, without hemorrhage or perforation: Secondary | ICD-10-CM | POA: Diagnosis not present

## 2017-10-16 DIAGNOSIS — R1012 Left upper quadrant pain: Principal | ICD-10-CM | POA: Insufficient documentation

## 2017-10-16 DIAGNOSIS — Z885 Allergy status to narcotic agent status: Secondary | ICD-10-CM | POA: Diagnosis not present

## 2017-10-16 DIAGNOSIS — G8929 Other chronic pain: Secondary | ICD-10-CM | POA: Insufficient documentation

## 2017-10-16 DIAGNOSIS — F419 Anxiety disorder, unspecified: Secondary | ICD-10-CM | POA: Diagnosis not present

## 2017-10-16 DIAGNOSIS — Z9884 Bariatric surgery status: Secondary | ICD-10-CM | POA: Insufficient documentation

## 2017-10-16 DIAGNOSIS — R1909 Other intra-abdominal and pelvic swelling, mass and lump: Secondary | ICD-10-CM | POA: Diagnosis not present

## 2017-10-16 LAB — COMPREHENSIVE METABOLIC PANEL
ALT: 17 U/L (ref 0–44)
AST: 15 U/L (ref 15–41)
Albumin: 3.9 g/dL (ref 3.5–5.0)
Alkaline Phosphatase: 112 U/L (ref 38–126)
Anion gap: 9 (ref 5–15)
BUN: 12 mg/dL (ref 6–20)
CHLORIDE: 104 mmol/L (ref 98–111)
CO2: 23 mmol/L (ref 22–32)
CREATININE: 0.73 mg/dL (ref 0.44–1.00)
Calcium: 11.4 mg/dL — ABNORMAL HIGH (ref 8.9–10.3)
GFR calc non Af Amer: >60 mL/min (ref >60)
Glucose, Bld: 97 mg/dL (ref 70–99)
Potassium: 4.4 mmol/L (ref 3.5–5.1)
Sodium: 136 mmol/L (ref 135–145)
Total Bilirubin: 0.8 mg/dL (ref 0.3–1.2)
Total Protein: 6.9 g/dL (ref 6.5–8.1)

## 2017-10-16 LAB — CBC
HCT: 47 % — ABNORMAL HIGH (ref 36.0–46.0)
Hemoglobin: 14.7 g/dL (ref 12.0–15.0)
MCH: 26.4 pg (ref 26.0–34.0)
MCHC: 31.3 g/dL (ref 30.0–36.0)
MCV: 84.4 fL (ref 78.0–100.0)
PLATELETS: 260 10*3/uL (ref 150–400)
RBC: 5.57 MIL/uL — AB (ref 3.87–5.11)
RDW: 14.8 % (ref 11.5–15.5)
WBC: 8.3 10*3/uL (ref 4.0–10.5)

## 2017-10-16 LAB — URINALYSIS, ROUTINE W REFLEX MICROSCOPIC
BILIRUBIN URINE: NEGATIVE
Bacteria, UA: NONE SEEN
Glucose, UA: NEGATIVE mg/dL
Hgb urine dipstick: NEGATIVE
KETONES UR: 80 mg/dL — AB
Nitrite: NEGATIVE
Protein, ur: NEGATIVE mg/dL
SPECIFIC GRAVITY, URINE: 1.015 (ref 1.005–1.030)
pH: 5 (ref 5.0–8.0)

## 2017-10-16 LAB — LIPASE, BLOOD: LIPASE: 34 U/L (ref 11–51)

## 2017-10-16 LAB — I-STAT BETA HCG BLOOD, ED (MC, WL, AP ONLY)

## 2017-10-16 MED ORDER — ONDANSETRON HCL 4 MG/2ML IJ SOLN: 4.0000 mg | Freq: Four times a day (QID) | INTRAMUSCULAR | Status: DC | PRN

## 2017-10-16 MED ORDER — IOPAMIDOL (ISOVUE-300) INJECTION 61%
100.0000 mL | Freq: Once | INTRAVENOUS | Status: AC | PRN
  Administered 2017-10-16: 100 mL via INTRAVENOUS

## 2017-10-16 MED ORDER — PANTOPRAZOLE SODIUM 40 MG IV SOLR
40.0000 mg | Freq: Once | INTRAVENOUS | Status: AC
  Administered 2017-10-16: 40 mg via INTRAVENOUS
  Filled 2017-10-16: qty 40

## 2017-10-16 MED ORDER — SODIUM CHLORIDE 0.9 % IV BOLUS
1000.0000 mL | Freq: Once | INTRAVENOUS | Status: AC
  Administered 2017-10-16: 1000 mL via INTRAVENOUS

## 2017-10-16 MED ORDER — HYDRALAZINE HCL 20 MG/ML IJ SOLN: 10.0000 mg | INTRAMUSCULAR | Status: DC | PRN

## 2017-10-16 MED ORDER — IOPAMIDOL (ISOVUE-300) INJECTION 61%
INTRAVENOUS | Status: AC
  Filled 2017-10-16: qty 100

## 2017-10-16 MED ORDER — FAMOTIDINE IN NACL 20-0.9 MG/50ML-% IV SOLN
20.0000 mg | Freq: Two times a day (BID) | INTRAVENOUS | Status: DC
  Administered 2017-10-16 – 2017-10-17 (×2): 20 mg via INTRAVENOUS
  Filled 2017-10-16 (×2): qty 50

## 2017-10-16 MED ORDER — ACETAMINOPHEN 650 MG RE SUPP: 650.0000 mg | Freq: Four times a day (QID) | RECTAL | Status: DC | PRN

## 2017-10-16 MED ORDER — HYDROCODONE-ACETAMINOPHEN 5-325 MG PO TABS
1.0000 | ORAL_TABLET | Freq: Once | ORAL | Status: AC
  Administered 2017-10-16: 1 via ORAL

## 2017-10-16 MED ORDER — ONDANSETRON 4 MG PO TBDP: 4.0000 mg | ORAL_TABLET | Freq: Four times a day (QID) | ORAL | Status: DC | PRN

## 2017-10-16 MED ORDER — SODIUM CHLORIDE 0.9 % IV SOLN
INTRAVENOUS | Status: DC
  Administered 2017-10-16 – 2017-10-17 (×2): via INTRAVENOUS

## 2017-10-16 MED ORDER — MORPHINE SULFATE (PF) 2 MG/ML IV SOLN: 1.0000 mg | INTRAVENOUS | Status: DC | PRN

## 2017-10-16 MED ORDER — ENOXAPARIN SODIUM 40 MG/0.4ML ~~LOC~~ SOLN
40.0000 mg | SUBCUTANEOUS | Status: DC
  Administered 2017-10-16: 40 mg via SUBCUTANEOUS
  Filled 2017-10-16: qty 0.4

## 2017-10-16 MED ORDER — OXYCODONE HCL 5 MG PO TABS
5.0000 mg | ORAL_TABLET | ORAL | Status: DC | PRN
  Administered 2017-10-16: 10 mg via ORAL
  Filled 2017-10-16: qty 2

## 2017-10-16 MED ORDER — ACETAMINOPHEN 325 MG PO TABS
650.0000 mg | ORAL_TABLET | Freq: Four times a day (QID) | ORAL | Status: DC | PRN
  Administered 2017-10-17: 650 mg via ORAL
  Filled 2017-10-16: qty 2

## 2017-10-16 MED ORDER — ONDANSETRON 4 MG PO TBDP
8.0000 mg | ORAL_TABLET | Freq: Once | ORAL | Status: AC
  Administered 2017-10-16: 8 mg via ORAL
  Filled 2017-10-16: qty 2

## 2017-10-16 MED ORDER — SUCRALFATE 1 GM/10ML PO SUSP
1.0000 g | Freq: Three times a day (TID) | ORAL | Status: DC
  Administered 2017-10-16 – 2017-10-17 (×3): 1 g via ORAL
  Filled 2017-10-16 (×3): qty 10

## 2017-10-16 NOTE — ED Triage Notes (Signed)
Pt reports L sided rib cage pain X4 days, worsening today. Pt states pain is sharp, tender with palpation. Also reports nausea.

## 2017-10-16 NOTE — ED Notes (Signed)
EKG completed

## 2017-10-16 NOTE — ED Provider Notes (Signed)
[facility] EMERGENCY DEPARTMENT Provider Note   CSN: [id number] Arrival date & time: 10/16/17  1348     History   Chief Complaint Chief Complaint  Patient presents with  . Side Pain    HPI Cynthia Hoover is a 52 y.o. female who presents with LUQ pain. PMH significant for chronic back pain due to multiple compression fractures, brain cyst. Past surgical hx significant for gastric bypass which was done over 10 years ago in [place]. She states that over the past 3 days she's had a constant, gradually worsening pain in the LUQ. She describes it as a cramping pain. It does not radiate. She reports associated decreased appetite and PO intake, diaphoresis, chills, nausea, dry heaves, constipation. Pain is worse with eating. Her husband states she has been sleeping a lot as well. No fever, chest pain, SOB, cough, flank pain, diarrhea, urinary symptoms. No recent falls or trauma. She has never had this before. She does not drink alcohol.  HPI  Past Medical History:  Diagnosis Date  . Anxiety   . Cyst of brain     Patient Active Problem List   Diagnosis Date Noted  . Open wound of right elbow 02/05/2016    Past Surgical History:  Procedure Laterality Date  . CESAREAN SECTION  1986     OB History   None      Home Medications    Prior to Admission medications   Medication Sig Start Date End Date Taking? Authorizing Provider  amoxicillin-clavulanate (AUGMENTIN) 875-125 MG tablet Take 1 tablet by mouth 2 (two) times daily. 01/30/16   Juline Patch, MD  desvenlafaxine (PRISTIQ) 50 MG 24 hr tablet Take 1 tablet (50 mg total) by mouth daily. 01/30/16   Juline Patch, MD  ibuprofen (ADVIL,MOTRIN) 200 MG tablet Take 400 mg by mouth every 6 (six) hours as needed for moderate pain.    [provider]    Family History Family History  Adopted: Yes    Social History Social History   Tobacco Use  . Smoking status: Never Smoker  . Smokeless  tobacco: Never Used  Substance Use Topics  . Alcohol use: No  . Drug use: No     Allergies   Patient has no known allergies.   Review of Systems Review of Systems  Constitutional: Positive for appetite change, chills and diaphoresis. Negative for fever.  Respiratory: Negative for cough and shortness of breath.   Cardiovascular: Negative for chest pain.  Gastrointestinal: Positive for abdominal pain, constipation, nausea and vomiting (dry heaves). Negative for diarrhea.  Genitourinary: Negative for difficulty urinating, dysuria, flank pain and hematuria.  All other systems reviewed and are negative.    Physical Exam Updated Vital Signs BP (!) 155/105 (BP Location: Right Arm)   Pulse 82   Temp 97.6 F (36.4 C) (Oral)   Resp 12   Ht 5\' 8"  (1.727 m)   Wt 93 kg   LMP  (LMP Unknown) Comment: NEG preg test on 01/11/16  SpO2 98%   BMI 31.17 kg/m   Physical Exam  Constitutional: She is oriented to person, place, and time. She appears well-developed and well-nourished. No distress.  Calm, cooperative. Appears uncomfortable  HENT:  Head: Normocephalic and atraumatic.  Eyes: Pupils are equal, round, and reactive to light. Conjunctivae are normal. Right eye exhibits no discharge. Left eye exhibits no discharge. No scleral icterus.  Neck: Normal range of motion.  Cardiovascular: Normal rate and regular rhythm.  Pulmonary/Chest: Effort normal and  breath sounds normal. No respiratory distress. She exhibits no tenderness.  Abdominal: Soft. Bowel sounds are normal. She exhibits no distension and no mass. There is tenderness (LUQ). There is no rebound and no guarding. No hernia.  Prior surgical scars  Neurological: She is alert and oriented to person, place, and time.  Skin: Skin is warm and dry.  Psychiatric: She has a normal mood and affect. Her behavior is normal.  Nursing note and vitals reviewed.    ED Treatments / Results  Labs (all labs ordered are listed, but only  abnormal results are displayed) Labs Reviewed  COMPREHENSIVE METABOLIC PANEL - Abnormal; Notable for the following components:      Result Value   Calcium 11.4 (*)    All other components within normal limits  CBC - Abnormal; Notable for the following components:   RBC 5.57 (*)    HCT 47.0 (*)    All other components within normal limits  URINALYSIS, ROUTINE W REFLEX MICROSCOPIC - Abnormal; Notable for the following components:   Ketones, ur 80 (*)    Leukocytes, UA TRACE (*)    All other components within normal limits  LIPASE, BLOOD  I-STAT BETA HCG BLOOD, ED ([facility], [facility], [facility] ONLY)    EKG None  Radiology Ct Abdomen Pelvis W Contrast  Result Date: 10/16/2017 CLINICAL DATA:  Patient with left-sided rib pain for 4 days. Prior gastric bypass. EXAM: CT ABDOMEN AND PELVIS WITH CONTRAST TECHNIQUE: Multidetector CT imaging of the abdomen and pelvis was performed using the standard protocol following bolus administration of intravenous contrast. CONTRAST:  174mL ISOVUE-300 IOPAMIDOL (ISOVUE-300) INJECTION 61% COMPARISON:  None. FINDINGS: Lower chest: Normal heart size. Dependent atelectasis within the bilateral lower lobes. No pleural effusion. Hepatobiliary: Liver is normal in size and contour. No focal lesion identified. Gallbladder is unremarkable. No intrahepatic or extrahepatic biliary ductal dilatation. Pancreas: Unremarkable Spleen: Unremarkable Adrenals/Urinary Tract: Normal adrenal glands. Kidneys enhance symmetrically with contrast. No hydronephrosis. Urinary bladder is unremarkable. Stomach/Bowel: Postsurgical changes compatible with gastric bypass procedure. There is wall thickening about the gastrojejunal surgical suture line. Additionally, the proximal most portion of jejunum at the level of the stomach (image 25; series 3) demonstrates wall thickening and surrounding fat stranding. No evidence for small bowel obstruction. Normal appendix. No free intraperitoneal air. Vascular/Lymphatic:  Normal caliber abdominal aorta. Peripheral calcified atherosclerotic plaque. Reproductive: Within the left adnexa there is a 2.8 cm mass (image 75; series 3). Normal appearance of the uterus. Other: Small amount of free fluid in the pelvis. Musculoskeletal: Lumbar spine degenerative changes. No aggressive or acute appearing osseous lesions. IMPRESSION: Postsurgical changes compatible with gastric bypass procedure. There is wall thickening and surrounding fat stranding at the gastrojejunal anastomosis with small bowel wall thickening. Findings may be infectious or inflammatory in etiology. The possibility of ulcer formation at this location should be considered. Indeterminate 2.8 cm left adnexal mass. In the nonacute setting, recommend further evaluation with pelvic ultrasound. Electronically Signed   By: Lovey Newcomer M.D.   On: 10/16/2017 17:27    Procedures Procedures (including critical care time)  Medications Ordered in ED Medications  iopamidol (ISOVUE-300) 61 % injection (has no administration in time range)  ondansetron (ZOFRAN-ODT) disintegrating tablet 8 mg (8 mg Oral Given 10/16/17 1505)  HYDROcodone-acetaminophen (NORCO/VICODIN) 5-325 MG per tablet 1 tablet (1 tablet Oral Given 10/16/17 1504)  sodium chloride 0.9 % bolus 1,000 mL (0 mLs Intravenous Stopped 10/16/17 1804)  iopamidol (ISOVUE-300) 61 % injection 100 mL (100 mLs Intravenous Contrast Given 10/16/17  1641)  pantoprazole (PROTONIX) injection 40 mg (40 mg Intravenous Given 10/16/17 1903)     Initial Impression / Assessment and Plan / ED Course  I have reviewed the triage vital signs and the nursing notes.  Pertinent labs & imaging results that were available during my care of the patient were reviewed by me and considered in my medical decision making (see chart for details).  52 year old non-pregnant female presents with LUQ pain for the past 3 days. She is hypertensive but otherwise vitals are normal. Likely secondary to pain as she  appears uncomfortable. Blood work and UA were obtained in triage. On exam she is tender in the LUQ. DDx pancreatitis, gastritis/PUD, constipation, bowel obstruction, kidney stone, kidney infection.  3:49 PM Blood work is largely unrevealing. CBC is normal. CMP and lipase are overall normal. Pain is better after Norco and Zofran. UA has 80 ketones but no obvious infection. Will order CT Abdomen/Pelvis to further assess and give fluids.   6:00PM Discussed with Dr. Donne Hazel with surgery who recommends admission, PPI, and GI consult.   7:30 PM Discussed with GI - Dr. Ronnette Juniper. She will see the patient in the hospital but advises that she cannot due an EGD until Tuesday at the earliest due to it being a holiday weekend.  Final Clinical Impressions(s) / ED Diagnoses   Final diagnoses:  LUQ pain  PUD (peptic ulcer disease)  Hypercalcemia    ED Discharge Orders    None       Recardo Evangelist, PA-C 10/16/17 2010    Mesner, Corene Cornea, MD 10/19/17 [phone]

## 2017-10-16 NOTE — ED Notes (Signed)
pts huisband work phone call any time  [phone]

## 2017-10-16 NOTE — ED Notes (Signed)
Pt being admitted.

## 2017-10-16 NOTE — ED Notes (Signed)
protonix given iv

## 2017-10-16 NOTE — H&P (Signed)
Cynthia Hoover is an 52 y.o. female.   Chief Complaint: luq abd pain HPI: 23 yof with history lap rygb in [place] 10 years ago.  Lost about 150 pounds and then has been stable for some time.  She does not smoke. Has had shoulder issues recently and has taken nsaids more.  She has about four days luq pain, constant, some nausea, no emesis. Pain not going away, nothing at home making better. Able to drink liquids now.  Having bowel function   Past Medical History:  Diagnosis Date  . Anxiety   . Cyst of brain     Past Surgical History:  Procedure Laterality Date  . CESAREAN SECTION  1986  lap rygb  Family History  Adopted: Yes   Social History:  reports that she has never smoked. She has never used smokeless tobacco. She reports that she does not drink alcohol or use drugs.  Allergies: No Known Allergies  meds wellbutrin  Results for orders placed or performed during the hospital encounter of 10/16/17 (from the past 48 hour(s))  Urinalysis, Routine w reflex microscopic     Status: Abnormal   Collection Time: 10/16/17  1:56 PM  Result Value Ref Range   Color, Urine YELLOW YELLOW   APPearance CLEAR CLEAR   Specific Gravity, Urine 1.015 1.005 - 1.030   pH 5.0 5.0 - 8.0   Glucose, UA NEGATIVE NEGATIVE mg/dL   Hgb urine dipstick NEGATIVE NEGATIVE   Bilirubin Urine NEGATIVE NEGATIVE   Ketones, ur 80 (A) NEGATIVE mg/dL   Protein, ur NEGATIVE NEGATIVE mg/dL   Nitrite NEGATIVE NEGATIVE   Leukocytes, UA TRACE (A) NEGATIVE   RBC / HPF 0-5 0 - 5 RBC/hpf   WBC, UA 0-5 0 - 5 WBC/hpf   Bacteria, UA NONE SEEN NONE SEEN   Squamous Epithelial / LPF 0-5 0 - 5   Mucus PRESENT    Hyaline Casts, UA PRESENT     Comment: Performed at [facility], [address], [place], [place] [address]  Lipase, blood     Status: None   Collection Time: 10/16/17  2:13 PM  Result Value Ref Range   Lipase 34 11 - 51 U/L    Comment: Performed at [facility] [address],  [place], [place] [address]  Comprehensive metabolic panel     Status: Abnormal   Collection Time: 10/16/17  2:13 PM  Result Value Ref Range   Sodium 136 135 - 145 mmol/L   Potassium 4.4 3.5 - 5.1 mmol/L   Chloride 104 98 - 111 mmol/L   CO2 23 22 - 32 mmol/L   Glucose, Bld 97 70 - 99 mg/dL   BUN 12 6 - 20 mg/dL   Creatinine, Ser 0.73 0.44 - 1.00 mg/dL   Calcium 11.4 (H) 8.9 - 10.3 mg/dL   Total Protein 6.9 6.5 - 8.1 g/dL   Albumin 3.9 3.5 - 5.0 g/dL   AST 15 15 - 41 U/L   ALT 17 0 - 44 U/L   Alkaline Phosphatase 112 38 - 126 U/L   Total Bilirubin 0.8 0.3 - 1.2 mg/dL   GFR calc non Af Amer >60 >60 mL/min   GFR calc Af Amer >60 >60 mL/min    Comment: (NOTE) The eGFR has been calculated using the CKD EPI equation. This calculation has not been validated in all clinical situations. eGFR's persistently <60 mL/min signify possible Chronic Kidney Disease.    Anion gap 9 5 - 15  Comment: Performed at [facility], [place] [address] [place], [place] [address]  CBC     Status: Abnormal   Collection Time: 10/16/17  2:13 PM  Result Value Ref Range   WBC 8.3 4.0 - 10.5 K/uL   RBC 5.57 (H) 3.87 - 5.11 MIL/uL   Hemoglobin 14.7 12.0 - 15.0 g/dL   HCT 47.0 (H) 36.0 - 46.0 %   MCV 84.4 78.0 - 100.0 fL   MCH 26.4 26.0 - 34.0 pg   MCHC 31.3 30.0 - 36.0 g/dL   RDW 14.8 11.5 - 15.5 %   Platelets 260 150 - 400 K/uL    Comment: Performed at [facility] [address] [place], [place] [address]  I-Stat beta hCG blood, ED     Status: None   Collection Time: 10/16/17  2:23 PM  Result Value Ref Range   I-stat hCG, quantitative <5.0 <5 mIU/mL   Comment 3            Comment:   GEST. AGE      CONC.  (mIU/mL)   <=1 WEEK        5 - 50     2 WEEKS       50 - 500     3 WEEKS       100 - 10,000     4 WEEKS     1,000 - 30,000        FEMALE AND NON-PREGNANT FEMALE:     LESS THAN 5 mIU/mL    Ct Abdomen Pelvis W Contrast  Result Date: 10/16/2017 CLINICAL DATA:  Patient with left-sided rib pain  for 4 days. Prior gastric bypass. EXAM: CT ABDOMEN AND PELVIS WITH CONTRAST TECHNIQUE: Multidetector CT imaging of the abdomen and pelvis was performed using the standard protocol following bolus administration of intravenous contrast. CONTRAST:  134m ISOVUE-300 IOPAMIDOL (ISOVUE-300) INJECTION 61% COMPARISON:  None. FINDINGS: Lower chest: Normal heart size. Dependent atelectasis within the bilateral lower lobes. No pleural effusion. Hepatobiliary: Liver is normal in size and contour. No focal lesion identified. Gallbladder is unremarkable. No intrahepatic or extrahepatic biliary ductal dilatation. Pancreas: Unremarkable Spleen: Unremarkable Adrenals/Urinary Tract: Normal adrenal glands. Kidneys enhance symmetrically with contrast. No hydronephrosis. Urinary bladder is unremarkable. Stomach/Bowel: Postsurgical changes compatible with gastric bypass procedure. There is wall thickening about the gastrojejunal surgical suture line. Additionally, the proximal most portion of jejunum at the level of the stomach (image 25; series 3) demonstrates wall thickening and surrounding fat stranding. No evidence for small bowel obstruction. Normal appendix. No free intraperitoneal air. Vascular/Lymphatic: Normal caliber abdominal aorta. Peripheral calcified atherosclerotic plaque. Reproductive: Within the left adnexa there is a 2.8 cm mass (image 75; series 3). Normal appearance of the uterus. Other: Small amount of free fluid in the pelvis. Musculoskeletal: Lumbar spine degenerative changes. No aggressive or acute appearing osseous lesions. IMPRESSION: Postsurgical changes compatible with gastric bypass procedure. There is wall thickening and surrounding fat stranding at the gastrojejunal anastomosis with small bowel wall thickening. Findings may be infectious or inflammatory in etiology. The possibility of ulcer formation at this location should be considered. Indeterminate 2.8 cm left adnexal mass. In the nonacute setting,  recommend further evaluation with pelvic ultrasound. Electronically Signed   By: DLovey NewcomerM.D.   On: 10/16/2017 17:27    Review of Systems  Gastrointestinal: Positive for abdominal pain and nausea.  All other systems reviewed and are negative.   Blood pressure (!) 155/105, pulse 82, temperature 97.6 F (36.4  C), temperature source Oral, resp. rate 12, height _0  (1.727 m), weight 93 kg, SpO2 98 %. Physical Exam  Vitals reviewed. Constitutional: She is oriented to person, place, and time. She appears well-developed and well-nourished.  HENT:  Head: Normocephalic and atraumatic.  Right Ear: External ear normal.  Left Ear: External ear normal.  Eyes: No scleral icterus.  Neck: Neck supple.  Cardiovascular: Normal rate and regular rhythm.  Respiratory: Effort normal and breath sounds normal.  GI: Soft. She exhibits distension. There is tenderness in the epigastric area and left upper quadrant. No hernia.  Lymphadenopathy:    She has no cervical adenopathy.  Neurological: She is alert and oriented to person, place, and time. She has normal strength.  Skin: Skin is warm and dry. She is not diaphoretic.  Psychiatric: She has a normal mood and affect. Her behavior is normal.     Assessment/Plan Abdominal pain, history bypass  I think with history and ct findings this is most indicative of a marginal ulcer.  She is uncomfortable and doesn't appear that she can go home.  I will admit her for ppi, carafate. Will need endoscopy at some point also. Will let her have clear liquids as well.  I dont think has internal hernia as source of pain.  Rolm Bookbinder, MD 10/16/2017, 7:14 PM

## 2017-10-16 NOTE — ED Notes (Signed)
To ct

## 2017-10-16 NOTE — ED Notes (Signed)
Patient c/o pain in left upper abd. Pain onset 3 days ago, c/o decreased appetite  And fluid intake.

## 2017-10-16 NOTE — ED Notes (Signed)
nss still infusing pt reports that she is ok

## 2017-10-16 NOTE — ED Notes (Signed)
IV FLUID INFUSED PT WAITING

## 2017-10-16 NOTE — ED Notes (Signed)
Pt returned from ct

## 2017-10-17 DIAGNOSIS — R1012 Left upper quadrant pain: Secondary | ICD-10-CM | POA: Diagnosis not present

## 2017-10-17 LAB — CBC
HCT: 40.9 % (ref 36.0–46.0)
Hemoglobin: 12.7 g/dL (ref 12.0–15.0)
MCH: 26.5 pg (ref 26.0–34.0)
MCHC: 31.1 g/dL (ref 30.0–36.0)
MCV: 85.2 fL (ref 78.0–100.0)
PLATELETS: 199 10*3/uL (ref 150–400)
RBC: 4.8 MIL/uL (ref 3.87–5.11)
RDW: 15.2 % (ref 11.5–15.5)
WBC: 5.1 10*3/uL (ref 4.0–10.5)

## 2017-10-17 LAB — BASIC METABOLIC PANEL
ANION GAP: 4 — AB (ref 5–15)
BUN: 9 mg/dL (ref 6–20)
CALCIUM: 10.6 mg/dL — AB (ref 8.9–10.3)
CO2: 28 mmol/L (ref 22–32)
CREATININE: 0.81 mg/dL (ref 0.44–1.00)
Chloride: 107 mmol/L (ref 98–111)
GFR calc Af Amer: >60 mL/min (ref >60)
GLUCOSE: 90 mg/dL (ref 70–99)
Potassium: 4.1 mmol/L (ref 3.5–5.1)
Sodium: 139 mmol/L (ref 135–145)

## 2017-10-17 MED ORDER — OMEPRAZOLE 40 MG PO CPDR: 40.0000 mg | DELAYED_RELEASE_CAPSULE | Freq: Every day | ORAL | 2 refills | Status: DC

## 2017-10-17 MED ORDER — SUCRALFATE 1 GM/10ML PO SUSP: 1.0000 g | Freq: Three times a day (TID) | ORAL | 2 refills | Status: DC

## 2017-10-17 NOTE — Discharge Summary (Signed)
Patient ID: Cynthia Hoover MRN: [id number] DOB/AGE: 1965-08-26 52 y.o.  Admit date: 10/16/2017 Discharge date: 10/17/2017  Discharge Diagnoses Patient Active Problem List   Diagnosis Date Noted  . Marginal ulcer 10/16/2017  . Open wound of right elbow 02/05/2016    Consultants None  Procedures None   Hospital Course: She was diagnosed with most likely a marginal ulcer at her G-J anastomosis from prior RYGB. This was most likely 2/2 NSAID use. She was started on PPI + carafate and dramatically improved. On 9/2, she had ~resolution of her abdominal pain and was tolerating a diet. She was deemed stable for discharge home. She was counseled on importance of NSAID avoidance indefinitely and complaince with her PPI + carafate as an outpt. She voiced understanding. We will have her f/u with one of our bariatric surgeons, Dr. Hassell Done in our [place] office.   Allergies as of 10/17/2017      Reactions   Morphine Other (See Comments)   Hallucinations   Codeine Nausea And Vomiting, Other (See Comments)   Dizzy      Medication List    STOP taking these medications   naproxen sodium 220 MG tablet Commonly known as:  ALEVE     TAKE these medications   buPROPion 300 MG 24 hr tablet Commonly known as:  WELLBUTRIN XL Take 300 mg by mouth daily.   desvenlafaxine 50 MG 24 hr tablet Commonly known as:  PRISTIQ Take 1 tablet (50 mg total) by mouth daily.   IRON PO Take 1 tablet by mouth daily.   multivitamin with minerals Tabs tablet Take 1 tablet by mouth daily.   omeprazole 40 MG capsule Commonly known as:  PRILOSEC Take 1 capsule (40 mg total) by mouth daily.   sucralfate 1 GM/10ML suspension Commonly known as:  CARAFATE Take 10 mLs (1 g total) by mouth 4 (four) times daily -  with meals and at bedtime.   TYLENOL PO Take 2 tablets by mouth daily as needed (pain/headache).   VITAMIN B-12 PO Take 1 tablet by mouth daily.   VITAMIN D3 PO Take 1 tablet by mouth daily.         Follow-up Information    Cynthia Lass, MD.   Specialty:  [facility] Contact information: [place] [address] [phone]        Cynthia Hausen, MD. Call in 1 day(s).   Specialty:  General Surgery Why:  You should see [place] in follow up in the next 1-2 weeks if possible Contact information: [place] [address] [phone]        Surgery, [place] Follow up.   Specialty:  General Surgery Why:  With Dr. Carter Hoover information: [address] [place] [address] [place] Dema Severin, M.D. [facility]

## 2017-10-17 NOTE — Progress Notes (Signed)
Subjective No acute events. Feeling much better this morning. Denies n/v. MEG/LUQ pain markedly improved.  Objective: Vital signs in last 24 hours: Temp:  [97.6 F (36.4 C)-98.4 F (36.9 C)] 98 F (36.7 C) (09/02 0455) Pulse Rate:  [59-82] 59 (09/02 0455) Resp:  [12-20] 18 (09/02 0455) BP: (105-158)/(67-105) 105/67 (09/02 0455) SpO2:  [95 %-99 %] 95 % (09/02 0455) Weight:  [93 kg-95.8 kg] 95.8 kg (09/01 2112) Last BM Date: 10/15/17  Intake/Output from previous day: No intake/output data recorded. Intake/Output this shift: No intake/output data recorded.  Gen: NAD, comfortable CV: RRR Pulm: Normal work of breathing Abd: Soft, NT/ND Ext: SCDs in place  Lab Results: CBC  Recent Labs    10/16/17 1413 10/17/17 0447  WBC 8.3 5.1  HGB 14.7 12.7  HCT 47.0* 40.9  PLT 260 199   BMET Recent Labs    10/16/17 1413 10/17/17 0447  NA 136 139  K 4.4 4.1  CL 104 107  CO2 23 28  GLUCOSE 97 90  BUN 12 9  CREATININE 0.73 0.81  CALCIUM 11.4* 10.6*   PT/INR No results for input(s): LABPROT, INR in the last 72 hours. ABG No results for input(s): PHART, HCO3 in the last 72 hours.  Invalid input(s): PCO2, PO2  Studies/Results:  Anti-infectives: Anti-infectives (From admission, onward)   None       Assessment/Plan: Patient Active Problem List   Diagnosis Date Noted  . Marginal ulcer 10/16/2017  . Open wound of right elbow 02/05/2016   Ms. Bisher is a pleasant 33yoF with hx of lap RYGB in [place] 73yrs ago - now with marginal ulcer at [place] most likely 2/2 chronic NSAID intake  -Continue PPI, carafate -Advised her on importance of cessation of all NSAIDs. Tylenol ok. 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 Donne Hazel with Dr. Hassell Done, one of our bariatric experts. Will plan to have her f/u with Dr. Hassell Done out in our [place] office.   LOS: 0 days    [place] Dema Severin, M.D. [facility]

## 2017-10-17 NOTE — Progress Notes (Signed)
Patient discharged to home with instructions. 

## 2017-10-17 NOTE — Discharge Instructions (Signed)
POST OP INSTRUCTIONS  1. DIET: As tolerated - avoid greasy/fatty/spicey foods.  2. Take your usually prescribed home medications unless otherwise directed.  a. You most likely have a marginal ulcer (gastric ulcer at the connection between your remnant stomach and small intestine) - this is most likely due to use of NSAIDs. It is important moving forward and for the rest of your life that you avoid all NSAIDs (Ibuprofen, Advil, Motrin, Aspirin, Alleve, naproxyn, etc). Tylenol is ok and safe. Additionally, you have been prescribed omeprazole and carafate. It is important that you take these medications as prescribed.  3. ACTIVITIES as tolerated:   4. FOLLOW UP in our office with Dr. Hassell Done. We have an office in [place] that you may see him at. a. Please call [facility] at [phone] to set up an appointment to see your surgeon in the office for a follow-up appointment - and at that time, request to be seen by Dr. Hassell Done in the [place] office b. Make sure that you call for this appointment the day you arrive home to insure a convenient appointment time.  9. If you have disability or family leave forms that need to be completed, you may have them completed by your primary care physician's office; for return to work instructions, please ask our office staff and they will be happy to assist you in obtaining this documentation   When to call us [phone]: 1. Poor pain control 2. Reactions / problems with new medications (rash/itching, etc)  3. Fever over 101.5 F (38.5 C) 4. Nausea/vomiting  The clinic staff is available to answer your questions during regular business hours (8:30am-5pm).  Please dont hesitate to call and ask to speak to one of our nurses for clinical concerns.   A surgeon from [facility] is always on call at the hospitals   If you have a medical emergency, go to the nearest emergency room or call 911.  [facility], [place] [address],  [place], [place],   [address] MAIN: [phone] FAX: [phone] [email or web address]

## 2017-10-20 DIAGNOSIS — K289 Gastrojejunal ulcer, unspecified as acute or chronic, without hemorrhage or perforation: Secondary | ICD-10-CM | POA: Diagnosis not present

## 2017-10-31 DIAGNOSIS — Z23 Encounter for immunization: Secondary | ICD-10-CM | POA: Diagnosis not present

## 2017-10-31 DIAGNOSIS — M8589 Other specified disorders of bone density and structure, multiple sites: Secondary | ICD-10-CM | POA: Diagnosis not present

## 2017-10-31 DIAGNOSIS — K289 Gastrojejunal ulcer, unspecified as acute or chronic, without hemorrhage or perforation: Secondary | ICD-10-CM | POA: Diagnosis not present

## 2017-11-01 ENCOUNTER — Other Ambulatory Visit: Payer: Self-pay | Admitting: Family Medicine

## 2017-11-01 DIAGNOSIS — N9489 Other specified conditions associated with female genital organs and menstrual cycle: Secondary | ICD-10-CM

## 2017-11-11 ENCOUNTER — Ambulatory Visit
Admission: RE | Admit: 2017-11-11 | Discharge: 2017-11-11 | Disposition: A | Discharge status: Home / Self Care | Payer: 59 | Source: Ambulatory Visit | Attending: Family Medicine | Admitting: Family Medicine

## 2017-11-11 DIAGNOSIS — N9489 Other specified conditions associated with female genital organs and menstrual cycle: Secondary | ICD-10-CM

## 2017-11-11 DIAGNOSIS — R1909 Other intra-abdominal and pelvic swelling, mass and lump: Secondary | ICD-10-CM | POA: Diagnosis not present

## 2017-11-15 ENCOUNTER — Encounter: Payer: Self-pay | Admitting: Neurology

## 2017-11-15 ENCOUNTER — Ambulatory Visit: Payer: 59 | Admitting: Neurology

## 2017-11-15 ENCOUNTER — Other Ambulatory Visit: Payer: Self-pay

## 2017-11-15 VITALS — BP 150/106 | HR 82 | Resp 18 | Ht 68.0 in | Wt 200.0 lb

## 2017-11-15 DIAGNOSIS — R413 Other amnesia: Secondary | ICD-10-CM | POA: Diagnosis not present

## 2017-11-15 DIAGNOSIS — Q046 Congenital cerebral cysts: Secondary | ICD-10-CM

## 2017-11-15 DIAGNOSIS — E538 Deficiency of other specified B group vitamins: Secondary | ICD-10-CM | POA: Insufficient documentation

## 2017-11-15 NOTE — Progress Notes (Signed)
PATIENT: Cynthia Hoover DOB: [birth year]  Chief Complaint  Patient presents with  . Colloid Cyst    Rm. 4  PCP: Kathyrn Lass.  Relocated to [place] from [place] in 2017. Sts. was followed by neuro in [place] for a colloid cyst.  Here today to establish care, f/u of colloid cyst. Sts. in the last 6 mos. she has noted more trouble with memory./fim     HISTORICAL  Cynthia Hoover is a 53 year old female, seen in request by her primary care physician Dr. Sabra Heck, Lattie Haw for evaluation of colloid cyst, initial evaluation was on November 15, 2017.  I have reviewed and summarized the referring note from the referring physician.  She had a past medical history of vitamin D deficiency, status post gastric bypass, thyroid nodule, hypercalcemia, polycystic ovarian disease, hirsutism, documented depression in the past, was treated with Pristiq in 2016  She loves horse riding, fell from horse few times, most recent one was in October 2015, with mild L1 compression fracture, there was incidental finding of colloid cyst, described 6 x 7 x 8 mm, later had follow-up imaging study in 2016, compared to previous study in August and December 2015, there reported no acute abnormality.  This happened in [place],  She moved to [place], fell from both ports again in 2017, had extensive imaging study, I personally reviewed CTs in November 2017, no acute intracranial process, incidental colloid cyst within the region of the right ventricle, no acute cervical abnormality.  She now complains of occasionally word finding difficulties since 2018, repeating herself, there was no significant long-term memory loss, she is a Engineer, maintenance of a [employer], struggling with presentation sometimes, gradually getting worse.  She did had transient loss of consciousness with previous fall injury, she has amnesia of previous fall in 2015, which is her most significant injury.  She is adopted, not sure about her family  history.  She had a history of bariatric surgery was about 100 pound weight loss in the past, reported taking her supplement regularly, also suffered mild depression, has been taking Wellbutrin 300 mg daily.  Recently she was found to have hypercalcemia, with elevated PTH, parathyroid surgery is pending.  Laboratory evaluations in July 2019, hemoglobin of 12.2, B12 of 243, vitamin D was 16, CMP, creatinine of 0.66, ionized calcium was elevated 6.2, hepatitis C was negative, cholesterol was 184, LDL was 100, PTH was elevated 110, serum calcium was elevated at 11.6, TSh 1.57  REVIEW OF SYSTEMS: Full 14 system review of systems performed and notable only for as above All other review of systems were negative.  ALLERGIES: Allergies  Allergen Reactions  . Morphine Other (See Comments)    Hallucinations   . Codeine Nausea And Vomiting and Other (See Comments)    Dizzy    HOME MEDICATIONS: Current Outpatient Medications  Medication Sig Dispense Refill  . Acetaminophen (TYLENOL PO) Take 2 tablets by mouth daily as needed (pain/headache).    Marland Kitchen buPROPion (WELLBUTRIN XL) 300 MG 24 hr tablet Take 300 mg by mouth daily.  3  . Cholecalciferol (VITAMIN D3 PO) Take 1 tablet by mouth daily.    . Cyanocobalamin (VITAMIN B-12 PO) Take 1 tablet by mouth daily.    . IRON PO Take 1 tablet by mouth daily.    . Multiple Vitamin (MULTIVITAMIN WITH MINERALS) TABS tablet Take 1 tablet by mouth daily.    Marland Kitchen omeprazole (PRILOSEC) 40 MG capsule Take 1 capsule (40 mg total) by mouth daily. 30 capsule 2  .  sucralfate (CARAFATE) 1 GM/10ML suspension Take 10 mLs (1 g total) by mouth 4 (four) times daily -  with meals and at bedtime. 420 mL 2   Current Facility-Administered Medications  Medication Dose Route Frequency Provider Last Rate Last Dose  . collagenase (SANTYL) ointment   Topical Daily Olean Ree, MD        PAST MEDICAL HISTORY: Past Medical History:  Diagnosis Date  . Anxiety   . Cyst of brain     . Hypertension   . Peptic ulcer   . Vision abnormalities     PAST SURGICAL HISTORY: Past Surgical History:  Procedure Laterality Date  . CESAREAN SECTION  1986    FAMILY HISTORY: Family History  Adopted: Yes  Family history unknown: Yes    SOCIAL HISTORY: Social History   Socioeconomic History  . Marital status: Married    Spouse name: Not on file  . Number of children: Not on file  . Years of education: Not on file  . Highest education level: Not on file  Occupational History  . Not on file  Social Needs  . Financial resource strain: Not on file  . Food insecurity:    Worry: Not on file    Inability: Not on file  . Transportation needs:    Medical: Not on file    Non-medical: Not on file  Tobacco Use  . Smoking status: Never Smoker  . Smokeless tobacco: Never Used  Substance and Sexual Activity  . Alcohol use: No  . Drug use: No  . Sexual activity: Yes  Lifestyle  . Physical activity:    Days per week: Not on file    Minutes per session: Not on file  . Stress: Not on file  Relationships  . Social connections:    Talks on phone: Not on file    Gets together: Not on file    Attends religious service: Not on file    Active member of club or organization: Not on file    Attends meetings of clubs or organizations: Not on file    Relationship status: Not on file  . Intimate partner violence:    Fear of current or ex partner: Not on file    Emotionally abused: Not on file    Physically abused: Not on file    Forced sexual activity: Not on file  Other Topics Concern  . Not on file  Social History Narrative  . Not on file     PHYSICAL EXAM   Vitals:   11/15/17 0741  BP: (!) 150/106  Pulse: 82  Resp: 18  Weight: 200 lb (90.7 kg)  Height: 5\' 8"  (1.727 m)    Not recorded      Body mass index is 30.41 kg/m.  PHYSICAL EXAMNIATION:  Gen: NAD, conversant, well nourised, obese, well groomed                     Cardiovascular: Regular rate  rhythm, no peripheral edema, warm, nontender. Eyes: Conjunctivae clear without exudates or hemorrhage Neck: Supple, no carotid bruits. Pulmonary: Clear to auscultation bilaterally   NEUROLOGICAL EXAM:  MENTAL STATUS: MMSE - Mini Mental State Exam 11/15/2017  Orientation to time 5  Orientation to Place 4  Registration 3  Attention/ Calculation 5  Recall 1  Language- name 2 objects 2  Language- repeat 1  Language- follow 3 step command 3  Language- read & follow direction 1  Write a sentence 1  Copy design 1  Total score 27  Animal naming 16   CRANIAL NERVES: CN II: Visual fields are full to confrontation. Fundoscopic exam is normal with sharp discs and no vascular changes. Pupils are round equal and briskly reactive to light. CN III, IV, VI: extraocular movement are normal. No ptosis. CN V: Facial sensation is intact to pinprick in all 3 divisions bilaterally. Corneal responses are intact.  CN VII: Face is symmetric with normal eye closure and smile. CN VIII: Hearing is normal to rubbing fingers CN IX, X: Palate elevates symmetrically. Phonation is normal. CN XI: Head turning and shoulder shrug are intact CN XII: Tongue is midline with normal movements and no atrophy.  MOTOR: There is no pronator drift of out-stretched arms. Muscle bulk and tone are normal. Muscle strength is normal.  REFLEXES: Reflexes are 2+ and symmetric at the biceps, triceps, knees, and ankles. Plantar responses are flexor.  SENSORY: Intact to light touch, pinprick, positional sensation and vibratory sensation are intact in fingers and toes.  COORDINATION: Rapid alternating movements and fine finger movements are intact. There is no dysmetria on finger-to-nose and heel-knee-shin.    GAIT/STANCE: Posture is normal. Gait is steady with normal steps, base, arm swing, and turning. Heel and toe walking are normal. Tandem gait is normal.  Romberg is absent.   DIAGNOSTIC DATA (LABS, IMAGING, TESTING) -  I reviewed patient records, labs, notes, testing and imaging myself where available.   ASSESSMENT AND PLAN  Cynthia Hoover is a 52 y.o. female   Mild cognitive impairment, History of head trauma Evidence of colloid cyst by previous imaging study,  Repeat MRI of brain without contrast,  Laboratory evaluation for treatable causes of memory loss    Marcial Pacas, M.D. Ph.D.  [facility] [address], [place], [place] [address] Ph: [phone] Fax: [phone]  CC: Referring Provider

## 2017-11-17 ENCOUNTER — Telehealth: Payer: Self-pay | Admitting: *Deleted

## 2017-11-17 LAB — VITAMIN B12: VITAMIN B 12: 1173 pg/mL (ref 232–1245)

## 2017-11-17 LAB — FOLATE: Folate: >20 ng/mL (ref >3.0)

## 2017-11-17 LAB — METHYLMALONIC ACID, SERUM: METHYLMALONIC ACID: 154 nmol/L (ref 0–378)

## 2017-11-17 LAB — COPPER, SERUM: Copper: 102 ug/dL (ref 72–166)

## 2017-11-17 LAB — RPR: RPR Ser Ql: NONREACTIVE

## 2017-11-17 NOTE — Telephone Encounter (Signed)
Spoke to patient - she is aware of her lab results. 

## 2017-11-17 NOTE — Telephone Encounter (Signed)
-----   Message from Marcial Pacas, MD sent at 11/17/2017  1:45 PM EDT ----- Please call patient for normal laboratory result

## 2017-11-21 ENCOUNTER — Telehealth: Payer: Self-pay | Admitting: Neurology

## 2017-11-21 ENCOUNTER — Encounter: Payer: Self-pay | Admitting: Neurology

## 2017-11-21 NOTE — Telephone Encounter (Signed)
[employer] pending faxed clinical notes  °

## 2017-11-21 NOTE — Telephone Encounter (Signed)
Called [employer] and it is still pending they did receive my fax.

## 2017-11-23 NOTE — Telephone Encounter (Signed)
I called [employer] to check the status it is still pending in medical review.

## 2017-11-24 NOTE — Telephone Encounter (Signed)
[employer] Auth: [id number] (exp. 11/23/17 to 01/07/18)  lvm for pt to call me back about scheduling mri.

## 2017-12-07 NOTE — Telephone Encounter (Signed)
Patient returned my call she is scheduled for 12/14/17 for [facility].

## 2017-12-14 ENCOUNTER — Ambulatory Visit: Payer: 59

## 2017-12-14 DIAGNOSIS — R413 Other amnesia: Secondary | ICD-10-CM | POA: Diagnosis not present

## 2017-12-15 ENCOUNTER — Telehealth: Payer: Self-pay | Admitting: Neurology

## 2017-12-15 NOTE — Telephone Encounter (Addendum)
Please call patient, MRI brain showed 104mm midline mass within 3rd ventricle near foramen of [place], likely incidental findings, no evidence of obstruction   MRI brain (without) demonstrating: - Stable isointense midline mass (73mm diameter on axial views) within third ventricle near the foramen of [place].  May represent colloid cyst. - No acute findings.

## 2017-12-16 NOTE — Telephone Encounter (Signed)
Spoke to patient to notify her of the MRI findings.  She was already aware of the mass.  She appreciate the phone call.

## 2017-12-19 DIAGNOSIS — N9489 Other specified conditions associated with female genital organs and menstrual cycle: Secondary | ICD-10-CM | POA: Diagnosis not present

## 2017-12-19 DIAGNOSIS — E041 Nontoxic single thyroid nodule: Secondary | ICD-10-CM | POA: Diagnosis not present

## 2017-12-19 DIAGNOSIS — E210 Primary hyperparathyroidism: Secondary | ICD-10-CM | POA: Diagnosis not present

## 2017-12-20 ENCOUNTER — Other Ambulatory Visit: Payer: Self-pay | Admitting: Family Medicine

## 2017-12-20 DIAGNOSIS — N9489 Other specified conditions associated with female genital organs and menstrual cycle: Secondary | ICD-10-CM

## 2018-01-02 ENCOUNTER — Other Ambulatory Visit: Payer: 59

## 2018-01-16 ENCOUNTER — Ambulatory Visit
Admission: RE | Admit: 2018-01-16 | Discharge: 2018-01-16 | Disposition: A | Discharge status: Home / Self Care | Payer: 59 | Source: Ambulatory Visit | Attending: Family Medicine | Admitting: Family Medicine

## 2018-01-16 DIAGNOSIS — N9489 Other specified conditions associated with female genital organs and menstrual cycle: Secondary | ICD-10-CM

## 2018-01-16 MED ORDER — GADOBENATE DIMEGLUMINE 529 MG/ML IV SOLN
19.0000 mL | Freq: Once | INTRAVENOUS | Status: AC | PRN
  Administered 2018-01-16: 19 mL via INTRAVENOUS

## 2018-02-20 DIAGNOSIS — Z9889 Other specified postprocedural states: Secondary | ICD-10-CM | POA: Diagnosis not present

## 2018-02-20 DIAGNOSIS — E213 Hyperparathyroidism, unspecified: Secondary | ICD-10-CM | POA: Diagnosis not present

## 2018-02-20 DIAGNOSIS — E041 Nontoxic single thyroid nodule: Secondary | ICD-10-CM | POA: Diagnosis not present

## 2018-02-21 ENCOUNTER — Other Ambulatory Visit (HOSPITAL_COMMUNITY): Payer: Self-pay | Admitting: Internal Medicine

## 2018-02-21 ENCOUNTER — Other Ambulatory Visit: Payer: Self-pay | Admitting: Internal Medicine

## 2018-02-21 DIAGNOSIS — E213 Hyperparathyroidism, unspecified: Secondary | ICD-10-CM

## 2018-02-28 ENCOUNTER — Other Ambulatory Visit: Payer: Self-pay

## 2018-02-28 ENCOUNTER — Ambulatory Visit: Payer: 59 | Admitting: Neurology

## 2018-02-28 ENCOUNTER — Encounter: Payer: Self-pay | Admitting: Neurology

## 2018-02-28 VITALS — BP 146/98 | HR 79 | Resp 14 | Ht 68.0 in | Wt 208.0 lb

## 2018-02-28 DIAGNOSIS — Q046 Congenital cerebral cysts: Secondary | ICD-10-CM

## 2018-02-28 DIAGNOSIS — R413 Other amnesia: Secondary | ICD-10-CM | POA: Diagnosis not present

## 2018-02-28 NOTE — Progress Notes (Signed)
PATIENT: Cynthia Hoover DOB: Oct 29, 1965  Chief Complaint  Patient presents with  . Memory Loss    Rm. 4.  Sts. memory is about the same. Continues to have difficulty remembering names, daily schedule, etc.  Seeing a surgeon next wk. for problems with parathyroid gland./fim  . Colloid Cyst     HISTORICAL  Update 02/28/2017:  She continues to have memory issues, specifically remembering names, and finding that she forgets conversations she may have related to her job. She finds that she makes notes and makes to-do lists, which are helpful to performing her job.  She feels her memory is at baseline over the last year. She was recently found by her PCP to have elevated calcium and is undergoing evaluation of her parathyroid gland. She thinks this may be contributing to her memory loss. She continues to ride horses and fortunately, hasn't had any more falls from her horse.   I personally reviewed MRI of the brain in October 2019, stable isointense midline mass within the third ventricle near the foraminal of [place], no evidence of obstruction, no acute abnormality  New Patient visit 11/15/2017:  I have reviewed and summarized the referring note from the referring physician.  Cynthia Hoover is a 53 year old female, seen in request by her primary care physician Dr. Sabra Heck, Lattie Haw for evaluation of colloid cyst, initial evaluation was on November 15, 2017.  I have reviewed and summarized the referring note from the referring physician.  She had a past medical history of vitamin D deficiency, status post gastric bypass, thyroid nodule, hypercalcemia, polycystic ovarian disease, hirsutism, documented depression in the past, was treated with Pristiq in 2016  She loves horse riding, fell from horse few times, most recent one was in October 2015, with mild L1 compression fracture, there was incidental finding of colloid cyst, described 6 x 7 x 8 mm, later had follow-up imaging study in 2016,  compared to previous study in August and December 2015, there reported no acute abnormality.  This happened in [place],  She moved to [place], fell from both ports again in 2017, had extensive imaging study, I personally reviewed CTs in November 2017, no acute intracranial process, incidental colloid cyst within the region of the right ventricle, no acute cervical abnormality.  She now complains of occasionally word finding difficulties since 2018, repeating herself, there was no significant long-term memory loss, she is a Engineer, maintenance of a [employer], struggling with presentation sometimes, gradually getting worse.  She did had transient loss of consciousness with previous fall injury, she has amnesia of previous fall in 2015, which is her most significant injury.  She is adopted, not sure about her family history.  She had a history of bariatric surgery was about 100 pound weight loss in the past, reported taking her supplement regularly, also suffered mild depression, has been taking Wellbutrin 300 mg daily.  Recently she was found to have hypercalcemia, with elevated PTH, parathyroid surgery is pending.  Laboratory evaluations in July 2019, hemoglobin of 12.2, B12 of 243, vitamin D was 16, CMP, creatinine of 0.66, ionized calcium was elevated 6.2, hepatitis C was negative, cholesterol was 184, LDL was 100, PTH was elevated 110, serum calcium was elevated at 11.6, TSh 1.57    REVIEW OF SYSTEMS: Full 14 system review of systems performed and notable only for joint pain, swollen lymph nodes, memory loss. All other review of systems were negative.  ALLERGIES: Allergies  Allergen Reactions  . Morphine Other (See Comments)  Hallucinations   . Codeine Nausea And Vomiting and Other (See Comments)    Dizzy    HOME MEDICATIONS: Current Outpatient Medications  Medication Sig Dispense Refill  . buPROPion (WELLBUTRIN XL) 300 MG 24 hr tablet Take 300 mg by mouth  daily.  3  . Cyanocobalamin (VITAMIN B-12 PO) Take 1 tablet by mouth daily.    . IRON PO Take 1 tablet by mouth daily.    Marland Kitchen VITAMIN D, CHOLECALCIFEROL, PO Take 1,000 Units by mouth daily.    Marland Kitchen omeprazole (PRILOSEC) 40 MG capsule Take 1 capsule (40 mg total) by mouth daily. 30 capsule 2   Current Facility-Administered Medications  Medication Dose Route Frequency Provider Last Rate Last Dose  . collagenase (SANTYL) ointment   Topical Daily Olean Ree, MD        PAST MEDICAL HISTORY: Past Medical History:  Diagnosis Date  . Anxiety   . Cyst of brain   . Hypertension   . Peptic ulcer   . Vision abnormalities     PAST SURGICAL HISTORY: Past Surgical History:  Procedure Laterality Date  . CESAREAN SECTION  1986    FAMILY HISTORY: Family History  Adopted: Yes  Family history unknown: Yes    SOCIAL HISTORY: Social History   Socioeconomic History  . Marital status: Married    Spouse name: Not on file  . Number of children: Not on file  . Years of education: Not on file  . Highest education level: Not on file  Occupational History  . Not on file  Social Needs  . Financial resource strain: Not on file  . Food insecurity:    Worry: Not on file    Inability: Not on file  . Transportation needs:    Medical: Not on file    Non-medical: Not on file  Tobacco Use  . Smoking status: Never Smoker  . Smokeless tobacco: Never Used  Substance and Sexual Activity  . Alcohol use: No  . Drug use: No  . Sexual activity: Yes  Lifestyle  . Physical activity:    Days per week: Not on file    Minutes per session: Not on file  . Stress: Not on file  Relationships  . Social connections:    Talks on phone: Not on file    Gets together: Not on file    Attends religious service: Not on file    Active member of club or organization: Not on file    Attends meetings of clubs or organizations: Not on file    Relationship status: Not on file  . Intimate partner violence:    Fear of  current or ex partner: Not on file    Emotionally abused: Not on file    Physically abused: Not on file    Forced sexual activity: Not on file  Other Topics Concern  . Not on file  Social History Narrative  . Not on file     PHYSICAL EXAM   Vitals:   02/28/18 0854  BP: (!) 146/98  Pulse: 79  Resp: 14  Weight: 208 lb (94.3 kg)  Height: 5\' 8"  (1.727 m)    Not recorded      Body mass index is 31.63 kg/m.  PHYSICAL EXAMNIATION:  Gen: NAD, conversant, well nourised, obese, well groomed                     Cardiovascular: Regular rate rhythm, no peripheral edema, warm, nontender. Eyes: Conjunctivae clear without exudates or hemorrhage  Neck: Supple, no carotid bruits. Pulmonary: Clear to auscultation bilaterally   NEUROLOGICAL EXAM:  MENTAL STATUS: Speech:    Speech is normal; fluent and spontaneous with normal comprehension.  Cognition:     Orientation to time, place and person     Normal recent and remote memory     Normal Attention span and concentration     Normal Language, naming, repeating,spontaneous speech     Fund of knowledge   CRANIAL NERVES: CN II: Visual fields are full to confrontation. Pupils are round equal and briskly reactive to light. CN III, IV, VI: extraocular movement are normal. No ptosis. CN V: Facial sensation is intact to pinprick in all 3 divisions bilaterally. Corneal responses are intact.  CN VII: Face is symmetric with normal eye closure and smile. CN VIII: Hearing is normal to rubbing fingers CN IX, X: Palate elevates symmetrically. Phonation is normal. CN XI: Head turning and shoulder shrug are intact CN XII: Tongue is midline with normal movements and no atrophy.  MOTOR: There is no pronator drift of out-stretched arms. Muscle bulk and tone are normal. Muscle strength is normal.  REFLEXES: Reflexes are 2+ and symmetric at the biceps, triceps, knees, and ankles. Plantar responses are flexor.  SENSORY: Intact to light touch,  pinprick, positional sensation and vibratory sensation are intact in fingers and toes.  COORDINATION: Rapid alternating movements and fine finger movements are intact. There is no dysmetria on finger-to-nose and heel-knee-shin.    GAIT/STANCE: Posture is normal. Gait is steady with normal steps, base, arm swing, and turning. Heel and toe walking are normal.  DIAGNOSTIC DATA (LABS, IMAGING, TESTING) - I reviewed patient records, labs, notes, testing and imaging myself where available.   ASSESSMENT AND PLAN  Cynthia Hoover is a 53 y.o. female    Mild cognitive impairment History of head trauma Evidence of colloid cyst previous imaging study  -Continue taking daily B12  -All labs were WNL October 2019   -MRI October 2019 showed stable colloid cyst  -Pending parathyroid gland evaluation, could be contributing to her memory issues   She will follow-up as needed at our office.   Marcial Pacas, M.D. Ph.D.  [facility] [address], [place], [place] [address] Ph: [phone] Fax: [phone]  CC: Referring Provider

## 2018-03-03 ENCOUNTER — Encounter (HOSPITAL_COMMUNITY): Payer: 59

## 2018-03-03 ENCOUNTER — Ambulatory Visit (HOSPITAL_COMMUNITY): Payer: 59

## 2018-03-13 ENCOUNTER — Ambulatory Visit (HOSPITAL_COMMUNITY)
Admission: RE | Admit: 2018-03-13 | Discharge: 2018-03-13 | Disposition: A | Discharge status: Home / Self Care | Payer: 59 | Source: Ambulatory Visit | Attending: Internal Medicine | Admitting: Internal Medicine

## 2018-03-13 DIAGNOSIS — E213 Hyperparathyroidism, unspecified: Secondary | ICD-10-CM | POA: Diagnosis not present

## 2018-03-13 MED ORDER — TECHNETIUM TC 99M SESTAMIBI GENERIC - CARDIOLITE
20.0000 | Freq: Once | INTRAVENOUS | Status: AC | PRN
  Administered 2018-03-13: 20 via INTRAVENOUS

## 2018-04-04 DIAGNOSIS — E210 Primary hyperparathyroidism: Secondary | ICD-10-CM | POA: Diagnosis not present

## 2018-04-07 ENCOUNTER — Other Ambulatory Visit: Payer: Self-pay | Admitting: Surgery

## 2018-04-07 DIAGNOSIS — E210 Primary hyperparathyroidism: Secondary | ICD-10-CM

## 2018-04-21 ENCOUNTER — Ambulatory Visit
Admission: RE | Admit: 2018-04-21 | Discharge: 2018-04-21 | Disposition: A | Discharge status: Home / Self Care | Payer: 59 | Source: Ambulatory Visit | Attending: Surgery | Admitting: Surgery

## 2018-04-21 DIAGNOSIS — E210 Primary hyperparathyroidism: Secondary | ICD-10-CM

## 2018-04-21 MED ORDER — IOPAMIDOL (ISOVUE-300) INJECTION 61%
75.0000 mL | Freq: Once | INTRAVENOUS | Status: AC | PRN
  Administered 2018-04-21: 75 mL via INTRAVENOUS

## 2018-04-24 ENCOUNTER — Telehealth: Payer: 59 | Admitting: Family

## 2018-04-24 DIAGNOSIS — M545 Low back pain, unspecified: Secondary | ICD-10-CM

## 2018-04-24 MED ORDER — NAPROXEN 500 MG PO TABS: 500.0000 mg | ORAL_TABLET | Freq: Two times a day (BID) | ORAL | 0 refills | Status: AC

## 2018-04-24 MED ORDER — BACLOFEN 10 MG PO TABS: 10.0000 mg | ORAL_TABLET | Freq: Three times a day (TID) | ORAL | 0 refills | Status: AC

## 2018-04-24 NOTE — Progress Notes (Signed)
We are sorry that you are not feeling well.  Here is how we plan to help!  Based on what you have shared with me it looks like you mostly have acute back pain.  Acute back pain is defined as musculoskeletal pain that can resolve in 1-3 weeks with conservative treatment.  I have prescribed Naprosyn 500 mg twice a day non-steroid anti-inflammatory (NSAID) as well as Baclofen 10 mg every eight hours as needed which is a muscle relaxer  Some patients experience stomach irritation or in increased heartburn with anti-inflammatory drugs.  Please keep in mind that muscle relaxer's can cause fatigue and should not be taken while at work or driving.  Back pain is very common.  The pain often gets better over time.  The cause of back pain is usually not dangerous.  Most people can learn to manage their back pain on their own.   If your back pain does not improve or worsens over the next 2 days, you will need to see your PCP to be evaluated face to face.   Approximately 8 minutes spent reviewing and documenting in patients chart. Pt does have a colloid cyst, but after reviewing Neurologists note. It is stable and I believe this back pain is not related.     Home Care  Stay active.  Start with short walks on flat ground if you can.  Try to walk farther each day.  Do not sit, drive or stand in one place for more than 30 minutes.  Do not stay in bed.  Do not avoid exercise or work.  Activity can help your back heal faster.  Be careful when you bend or lift an object.  Bend at your knees, keep the object close to you, and do not twist.  Sleep on a firm mattress.  Lie on your side, and bend your knees.  If you lie on your back, put a pillow under your knees.  Only take medicines as told by your doctor.  Put ice on the injured area.  Put ice in a plastic bag  Place a towel between your skin and the bag  Leave the ice on for 15-20 minutes, 3-4 times a day for the first 2-3 days. 210 After that, you  can switch between ice and heat packs.  Ask your doctor about back exercises or massage.  Avoid feeling anxious or stressed.  Find good ways to deal with stress, such as exercise.  Get Help Right Way If:  Your pain does not go away with rest or medicine.  Your pain does not go away in 1 week.  You have new problems.  You do not feel well.  The pain spreads into your legs.  You cannot control when you poop (bowel movement) or pee (urinate)  You feel sick to your stomach (nauseous) or throw up (vomit)  You have belly (abdominal) pain.  You feel like you may pass out (faint).  If you develop a fever.  Make Sure you:  Understand these instructions.  Will watch your condition  Will get help right away if you are not doing well or get worse.  Your e-visit answers were reviewed by a board certified advanced clinical practitioner to complete your personal care plan.  Depending on the condition, your plan could have included both over the counter or prescription medications.  If there is a problem please reply  once you have received a response from your provider.  Your safety is important to [place].  If you have drug allergies check your prescription carefully.    You can use MyChart to ask questions about today's visit, request a non-urgent call back, or ask for a work or school excuse for 24 hours related to this e-Visit. If it has been greater than 24 hours you will need to follow up with your provider, or enter a new e-Visit to address those concerns.  You will get an e-mail in the next two days asking about your experience.  I hope that your e-visit has been valuable and will speed your recovery. Thank you for using e-visits.

## 2018-05-02 ENCOUNTER — Telehealth: Payer: 59 | Admitting: Nurse Practitioner

## 2018-05-02 DIAGNOSIS — J069 Acute upper respiratory infection, unspecified: Secondary | ICD-10-CM

## 2018-05-02 DIAGNOSIS — B9789 Other viral agents as the cause of diseases classified elsewhere: Secondary | ICD-10-CM | POA: Diagnosis not present

## 2018-05-02 MED ORDER — FLUTICASONE PROPIONATE 50 MCG/ACT NA SUSP: 2.0000 | Freq: Every day | NASAL | 6 refills | Status: DC

## 2018-05-02 MED ORDER — BENZONATATE 100 MG PO CAPS: 100.0000 mg | ORAL_CAPSULE | Freq: Three times a day (TID) | ORAL | 0 refills | Status: DC | PRN

## 2018-05-02 NOTE — Progress Notes (Signed)
We are sorry you are not feeling well.  Here is how we plan to help!  Based on what you have shared with me, it looks like you may have a viral upper respiratory infection.  Upper respiratory infections are caused by a large number of viruses; however, rhinovirus is the most common cause.   Symptoms vary from person to person, with common symptoms including sore throat, cough, and fatigue or lack of energy.  A low-grade fever of up to 100.4 may present, but is often uncommon.  Symptoms vary however, and are closely related to a person's age or underlying illnesses.  The most common symptoms associated with an upper respiratory infection are nasal discharge or congestion, cough, sneezing, headache and pressure in the ears and face.  These symptoms usually persist for about 3 to 10 days, but can last up to 2 weeks.  It is important to know that upper respiratory infections do not cause serious illness or complications in most cases.    Upper respiratory infections can be transmitted from person to person, with the most common method of transmission being a person's hands.  The virus is able to live on the skin and can infect other persons for up to 2 hours after direct contact.  Also, these can be transmitted when someone coughs or sneezes; thus, it is important to cover the mouth to reduce this risk.  To keep the spread of the illness at bay, good hand hygiene is very important.  This is an infection that is most likely caused by a virus. There are no specific treatments other than to help you with the symptoms until the infection runs its course.  We are sorry you are not feeling well.  Here is how we plan to help!   For nasal congestion, you may use an oral decongestants such as Mucinex D or if you have glaucoma or high blood pressure use plain Mucinex.  Saline nasal spray or nasal drops can help and can safely be used as often as needed for congestion.  For your congestion, I have prescribed Fluticasone  nasal spray one spray in each nostril twice a day  If you do not have a history of heart disease, hypertension, diabetes or thyroid disease, prostate/bladder issues or glaucoma, you may also use Sudafed to treat nasal congestion.  It is highly recommended that you consult with a pharmacist or your primary care physician to ensure this medication is safe for you to take.     If you have a cough, you may use cough suppressants such as Delsym and Robitussin.  If you have glaucoma or high blood pressure, you can also use Coricidin HBP.   For cough I have prescribed for you A prescription cough medication called Tessalon Perles 100 mg. You may take 1-2 capsules every 8 hours as needed for cough  If you have a sore or scratchy throat, use a saltwater gargle-  to  teaspoon of salt dissolved in a 4-ounce to 8-ounce glass of warm water.  Gargle the solution for approximately 15-30 seconds and then spit.  It is important not to swallow the solution.  You can also use throat lozenges/cough drops and Chloraseptic spray to help with throat pain or discomfort.  Warm or cold liquids can also be helpful in relieving throat pain.  For headache, pain or general discomfort, you can use Ibuprofen or Tylenol as directed.   Some authorities believe that zinc sprays or the use of Echinacea may shorten the   course of your symptoms.   HOME CARE . Only take medications as instructed by your medical team. . Be sure to drink plenty of fluids. Water is fine as well as fruit juices, sodas and electrolyte beverages. You may want to stay away from caffeine or alcohol. If you are nauseated, try taking small sips of liquids. How do you know if you are getting enough fluid? Your urine should be a pale yellow or almost colorless. . Get rest. . Taking a steamy shower or using a humidifier may help nasal congestion and ease sore throat pain. You can place a towel over your head and breathe in the steam from hot water coming from a  faucet. . Using a saline nasal spray works much the same way. . Cough drops, hard candies and sore throat lozenges may ease your cough. . Avoid close contacts especially the very young and the elderly . Cover your mouth if you cough or sneeze . Always remember to wash your hands.   GET HELP RIGHT AWAY IF: . You develop worsening fever. . If your symptoms do not improve within 10 days . You develop yellow or green discharge from your nose over 3 days. . You have coughing fits . You develop a severe head ache or visual changes. . You develop shortness of breath, difficulty breathing or start having chest pain . Your symptoms persist after you have completed your treatment plan  MAKE SURE YOU   Understand these instructions.  Will watch your condition.  Will get help right away if you are not doing well or get worse.  Your e-visit answers were reviewed by a board certified advanced clinical practitioner to complete your personal care plan. Depending upon the condition, your plan could have included both over the counter or prescription medications. Please review your pharmacy choice. If there is a problem, you may call our nursing hot line at and have the prescription routed to another pharmacy. Your safety is important to us. If you have drug allergies check your prescription carefully.   You can use MyChart to ask questions about today's visit, request a non-urgent call back, or ask for a work or school excuse for 24 hours related to this e-Visit. If it has been greater than 24 hours you will need to follow up with your provider, or enter a new e-Visit to address those concerns. You will get an e-mail in the next two days asking about your experience.  I hope that your e-visit has been valuable and will speed your recovery. Thank you for using e-visits.   5 minutes spent reviewing and documenting in chart.    

## 2018-05-05 ENCOUNTER — Other Ambulatory Visit: Payer: Self-pay | Admitting: Family

## 2018-05-15 ENCOUNTER — Ambulatory Visit: Payer: Self-pay | Admitting: Surgery

## 2018-06-12 ENCOUNTER — Encounter: Payer: Self-pay | Admitting: Physician Assistant

## 2018-06-12 ENCOUNTER — Telehealth: Payer: 59 | Admitting: Physician Assistant

## 2018-06-12 DIAGNOSIS — S20211A Contusion of right front wall of thorax, initial encounter: Secondary | ICD-10-CM | POA: Diagnosis not present

## 2018-06-12 DIAGNOSIS — W19XXXA Unspecified fall, initial encounter: Secondary | ICD-10-CM

## 2018-06-12 DIAGNOSIS — S40011A Contusion of right shoulder, initial encounter: Secondary | ICD-10-CM | POA: Diagnosis not present

## 2018-06-12 DIAGNOSIS — R0789 Other chest pain: Secondary | ICD-10-CM

## 2018-06-12 DIAGNOSIS — S7001XA Contusion of right hip, initial encounter: Secondary | ICD-10-CM | POA: Diagnosis not present

## 2018-06-12 NOTE — Progress Notes (Signed)
Based on what you shared with me, I feel your condition warrants further evaluation and I recommend that you be seen for a face to face office visit.  Cynthia Hoover,  I have called and left a voicemail. If you are experiencing severe pain then I recommend a face to face evaluation to exclude any acute problems such as fractures due to the fall.      NOTE: If you entered your credit card information for this eVisit, you will not be charged. You may see a "hold" on your card for the $35 but that hold will drop off and you will not have a charge processed.  If you are having a true medical emergency please call 911.  If you need an urgent face to face visit, [facility] has four urgent care centers for your convenience.    PLEASE NOTE: THE [facility] LOCATIONS AND URGENT CARE CLINICS DO NOT HAVE THE TESTING FOR CORONAVIRUS COVID19 AVAILABLE.  IF YOU FEEL YOU NEED THIS TEST YOU MUST GO TO A TRIAGE LOCATION AT [place]   [email or web address] to reserve your spot online an avoid wait times  [facility] [address], [address] [place], [place] [address] Modified hours of operation: Monday-Friday, 10 AM to 6 PM  Saturday & Sunday 10 AM to 4 PM *Across the street from [place] (New Address!) [address] [place], [place] [address] *Just off [address], across the road from [place] hours of operation: Monday-Friday, 10 AM to 5 PM  Closed Saturday & Sunday   The following sites will take your insurance:  . [facility]gent [place] a Provider at this Location  [address] [place], [place] [address] . 10 am to 8 pm Monday-Friday . 12 pm to 8 pm Saturday-Sunday   . [facility]gent [facility] at [place] a Provider at this Location  [place], [place]  [place], [place] [address] . 8 am to 8 pm Monday-Friday . 9 am to 6 pm Saturday . 11 am to 6 pm Sunday   . [facility] at [place] Get Driving Directions  [address] [place], [place] [address] . 8 am to 8 pm Monday-Friday . 8 am to 4 pm Saturday-Sunday   Your e-visit answers were reviewed by a board certified advanced clinical practitioner to complete your personal care plan.  Thank you for using e-Visits. I have spent 7 min in completion and review of this note- [name] [facility]

## 2018-07-11 ENCOUNTER — Encounter: Payer: Self-pay | Admitting: Surgery

## 2018-07-11 DIAGNOSIS — E210 Primary hyperparathyroidism: Secondary | ICD-10-CM | POA: Diagnosis present

## 2018-07-11 NOTE — H&P (Signed)
General Surgery [facility]  Cynthia Hoover DOB: Feb 20, 1965 Married / Language: English / Race: White Female   History of Present Illness  The patient is a 54 year old female who presents with primary hyperparathyroidism.  CHIEF COMPLAINT: primary hyperparathyroidism  Patient is referred by Dr. Larna Daughters Avernini for surgical evaluation and management of primary hyperparathyroidism. Patient had been noted by her primary care physician to have elevated serum calcium levels. Most recent levels have ranged from 11.6-11.8. Intact PTH level was also elevated at 110-117. 24-hour urine collection for calcium was found to be elevated at 366. 25-hydroxy vitamin D level was low at 21 and the patient is taking vitamin D supplementation. Bone density scan was obtained by her endocrinologist and showed evidence of osteopenia. Patient was referred for thyroid ultrasound in the summer of 2019 which showed an enlarged thyroid gland with a stable right superior 1.5 cm thyroid nodule. There was no evidence of parathyroid adenoma. Patient underwent nuclear medicine parathyroid scan on March 13, 2018. This likewise demonstrated no evidence of parathyroid adenoma. Patient is now referred for surgical assessment and recommendations. Patient has had no prior head or neck surgery. Patient is symptomatic from her primary hyperparathyroidism with chronic fatigue, bone and joint pain, memory loss, and osteopenia. There is no family history of parathyroid disease or other endocrine neoplasms. The thyroid nodule has undergone previous biopsy in [place] and has remained stable in size for many years.   Problem List/Past Medical MARGINAL ULCER (K28.9) PRIMARY HYPERPARATHYROIDISM (E21.0)   Past Surgical History Cesarean Section - 1  Gastric Bypass  Oral Surgery  Resection of Stomach   Diagnostic Studies History Colonoscopy  never Mammogram  1-3 years ago Pap Smear   1-5 years ago  Allergies  Morphine Sulfate (PF) *ANALGESICS - OPIOID*  Allergies Reconciled   Medication History  buPROPion HCl ER (XL) (300MG  Tablet ER 24HR, Oral) Active. Carafate (1GM/10ML Suspension, Oral) Active. Omeprazole (40MG  Capsule DR, Oral) Active. Desvenlafaxine ER (50MG  Tablet ER 24HR, Oral) Active. Iron (325 (65 Fe)MG Tablet, Oral) Active. Multivitamin Adult (Oral) Active. Vitamin B12 (1000MCG Tablet ER, Oral) Active. Vitamin D3 (1000UNIT Capsule, Oral) Active. Medications Reconciled  Social History Alcohol use  Occasional alcohol use. Caffeine use  Carbonated beverages, Coffee, Tea. Tobacco use  Never smoker. No drug use   Family History  Family history unknown  First Degree Relatives   Pregnancy / Birth History Age at menarche  24 years. Age of menopause  60-55 Gravida  2 Maternal age  66-20 Para  2 Contraceptive History  Oral contraceptives. Irregular periods   Other Problems Anxiety Disorder  Back Pain  High blood pressure  Thyroid Disease  Depression  Unspecified Diagnosis   Review of Systems General Present- Fatigue and Weight Gain. Not Present- Appetite Loss, Chills, Fever, Night Sweats and Weight Loss. Skin Not Present- Change in Wart/Mole, Dryness, Hives, Jaundice, New Lesions, Non-Healing Wounds, Rash and Ulcer. HEENT Present- Hearing Loss and Wears glasses/contact lenses. Not Present- Earache, Hoarseness, Nose Bleed, Oral Ulcers, Ringing in the Ears, Seasonal Allergies, Sinus Pain, Sore Throat, Visual Disturbances and Yellow Eyes. Respiratory Not Present- Bloody sputum, Chronic Cough, Difficulty Breathing, Snoring and Wheezing. Breast Not Present- Breast Mass, Breast Pain, Nipple Discharge and Skin Changes. Cardiovascular Not Present- Chest Pain, Difficulty Breathing Lying Down, Leg Cramps, Palpitations, Rapid Heart Rate, Shortness of Breath and Swelling of Extremities. Gastrointestinal Not Present- Abdominal  Pain, Bloating, Bloody Stool, Change in Bowel Habits, Chronic diarrhea, Constipation, Difficulty Swallowing, Excessive gas, Gets full  quickly at meals, Hemorrhoids, Indigestion, Nausea, Rectal Pain and Vomiting. Female Genitourinary Not Present- Frequency, Nocturia, Painful Urination, Pelvic Pain and Urgency. Musculoskeletal Present- Back Pain and Joint Pain. Not Present- Joint Stiffness, Muscle Pain, Muscle Weakness and Swelling of Extremities. Neurological Present- Decreased Memory. Not Present- Fainting, Headaches, Numbness, Seizures, Tingling, Tremor, Trouble walking and Weakness. Psychiatric Not Present- Anxiety, Bipolar, Change in Sleep Pattern, Depression, Fearful and Frequent crying. Endocrine Present- Hair Changes. Not Present- Cold Intolerance, Excessive Hunger, Heat Intolerance, Hot flashes and New Diabetes. Hematology Not Present- Blood Thinners, Easy Bruising, Excessive bleeding, Gland problems, HIV and Persistent Infections.  Vitals Weight: 210.2 lb Height: 69in Body Surface Area: 2.11 m Body Mass Index: 31.04 kg/m  Temp.: 98.37F  Pulse: 85 (Regular)  BP: 162/82 (Sitting, Left Arm, Standard)  Physical Exam  See vital signs recorded above  GENERAL APPEARANCE Development: normal Nutritional status: normal Gross deformities: none  SKIN Rash, lesions, ulcers: none Induration, erythema: none Nodules: none palpable  EYES Conjunctiva and lids: normal Pupils: equal and reactive Iris: normal bilaterally  EARS, NOSE, MOUTH, THROAT External ears: no lesion or deformity External nose: no lesion or deformity Hearing: grossly normal Lips: no lesion or deformity Dentition: normal for age Oral mucosa: moist  NECK Symmetric: yes Trachea: midline Thyroid: no palpable nodules in the thyroid bed  CHEST Respiratory effort: normal Retraction or accessory muscle use: no Breath sounds: normal bilaterally Rales, rhonchi, wheeze:  none  CARDIOVASCULAR Auscultation: regular rhythm, normal rate Murmurs: none Pulses: carotid and radial pulse 2+ palpable Lower extremity edema: none Lower extremity varicosities: none  MUSCULOSKELETAL Station and gait: normal Digits and nails: no clubbing or cyanosis Muscle strength: grossly normal all extremities Range of motion: grossly normal all extremities Deformity: none  LYMPHATIC Cervical: none palpable Supraclavicular: none palpable  PSYCHIATRIC Oriented to person, place, and time: yes Mood and affect: normal for situation Judgment and insight: appropriate for situation    Assessment & Plan  PRIMARY HYPERPARATHYROIDISM (E21.0)  Pt Education - Pamphlet Given - The Parathyroid Surgery Book: discussed with patient and provided information.  Follow Up - Call [facility] office after tests / studies doneto discuss further plans  Patient is referred by her endocrinologist for surgical evaluation and recommendations regarding primary hyperparathyroidism. Patient is provided with written literature on parathyroid surgery to review at home.  Patient has biochemical evidence of primary hyperparathyroidism. She has elevated calcium and PTH levels as well as an elevated 24-hour urine collection for calcium. Patient does have complications including osteopenia, fatigue, bone and joint pain, history of ulcer, and memory loss. Patient underwent ultrasound examination and nuclear medicine parathyroid scan, both of which failed to identify a parathyroid adenoma.  I have recommended proceeding with a 4D CT scan of the neck in hopes of identifying a parathyroid adenoma and confirming her diagnosis. We discussed minimally invasive parathyroid surgery. We discussed the risk and benefits of the procedure. We also discussed a traditional neck exploration. We discussed the potential for recurrent laryngeal nerve injury. We discussed the potential for a second gland adenoma. Patient  understands and agrees to proceed with further diagnostic testing which will likely lead to some type of surgical intervention in the near future.  Patient will undergo the 4D CT scan. We will contact her with those results and make further recommendations at that time.  ADDENDUM 4D CT positive for right inferior adenoma.  Plan minimally invasive surgery.  The risks and benefits of the procedure have been discussed at length with the patient.  The patient  understands the proposed procedure, potential alternative treatments, and the course of recovery to be expected.  All of the patient's questions have been answered at this time.  The patient wishes to proceed with surgery.  [name], [place] [facility] Office: [phone]

## 2018-08-03 ENCOUNTER — Inpatient Hospital Stay (HOSPITAL_COMMUNITY): Admission: RE | Admit: 2018-08-03 | Payer: 59 | Source: Ambulatory Visit

## 2018-08-04 NOTE — Patient Instructions (Addendum)
YOU ARE REQUIRED TO BE TESTED FOR COVID-19 PRIOR TO YOUR SURGERY . YOUR TEST MUST BE COMPLETED TODAY June 22ND. TESTING IS LOCATED AT [place] ENTRANCE FROM 9:00AM - 3:00PM. FAILURE TO COMPLETE TESTING MAY RESULT IN CANCELLATION OF YOUR SURGERY. ONCE YOUR COVID TEST IS COMPLETED, PLEASE BEGIN THE QUARANTINE INSTRUCTIONS AS OUTLINED IN YOUR HANDOUT.                 Cynthia Hoover     Your procedure is scheduled on: 08-10-2018  Report to [facility] Main  Entrance    Report to admitting at 530 AM      Call this number if you have problems the morning of surgery [phone]    Remember: Do not eat food or drink liquids :After Midnight. BRUSH YOUR TEETH MORNING OF SURGERY AND RINSE YOUR MOUTH OUT, NO CHEWING GUM CANDY OR MINTS.     Take these medicines the morning of surgery with A SIP OF WATER: BUPROPION [facility])                                 You may not have any metal on your body including hair pins and              piercings  Do not wear jewelry, make-up, lotions, powders or perfumes, deodorant             Do not wear nail polish.  Do not shave  48 hours prior to surgery.                 Do not bring valuables to the hospital. [place].  Contacts, dentures or bridgework may not be worn into surgery.  Leave suitcase in the car. After surgery it may be brought to your room.     Patients discharged the day of surgery will not be allowed to drive home. IF YOU ARE HAVING SURGERY AND GOING HOME THE SAME DAY, YOU MUST HAVE AN ADULT TO DRIVE YOU HOME AND BE WITH YOU FOR 24 HOURS. YOU MAY GO HOME BY TAXI OR UBER OR ORTHERWISE, BUT AN ADULT MUST ACCOMPANY YOU HOME AND STAY WITH YOU FOR 24 HOURS.  Name and phone number of your driver:  Special Instructions: N/A              Please read over the following fact sheets you were  given: _____________________________________________________________________             [facility] - Preparing for Surgery Before surgery, you can play an important role.  Because skin is not sterile, your skin needs to be as free of germs as possible.  You can reduce the number of germs on your skin by washing with CHG (chlorahexidine gluconate) soap before surgery.  CHG is an antiseptic cleaner which kills germs and bonds with the skin to continue killing germs even after washing. Please DO NOT use if you have an allergy to CHG or antibacterial soaps.  If your skin becomes reddened/irritated stop using the CHG and inform your nurse when you arrive at Short Stay. Do not shave (including legs and underarms) for at least 48 hours prior to the first CHG shower.  You may shave your face/neck. Please follow these instructions carefully:  1.  Shower with CHG Soap  the night before surgery and the  morning of Surgery.  2.  If you choose to wash your hair, wash your hair first as usual with your  normal  shampoo.  3.  After you shampoo, rinse your hair and body thoroughly to remove the  shampoo.                           4.  Use CHG as you would any other liquid soap.  You can apply chg directly  to the skin and wash                       Gently with a scrungie or clean washcloth.  5.  Apply the CHG Soap to your body ONLY FROM THE NECK DOWN.   Do not use on face/ open                           Wound or open sores. Avoid contact with eyes, ears mouth and genitals (private parts).                       Wash face,  Genitals (private parts) with your normal soap.             6.  Wash thoroughly, paying special attention to the area where your surgery  will be performed.  7.  Thoroughly rinse your body with warm water from the neck down.  8.  DO NOT shower/wash with your normal soap after using and rinsing off  the CHG Soap.                9.  Pat yourself dry with a clean towel.            10.  Wear  clean pajamas.            11.  Place clean sheets on your bed the night of your first shower and do not  sleep with pets. Day of Surgery : Do not apply any lotions/deodorants the morning of surgery.  Please wear clean clothes to the hospital/surgery center.  FAILURE TO FOLLOW THESE INSTRUCTIONS MAY RESULT IN THE CANCELLATION OF YOUR SURGERY PATIENT SIGNATURE_________________________________  NURSE SIGNATURE__________________________________  ________________________________________________________________________

## 2018-08-04 NOTE — Progress Notes (Signed)
EKG 10-16-17 Epic NEUROLOGY 02-28-18 DR [facility] EPIC

## 2018-08-07 ENCOUNTER — Other Ambulatory Visit: Payer: Self-pay

## 2018-08-07 ENCOUNTER — Encounter (HOSPITAL_COMMUNITY): Payer: Self-pay

## 2018-08-07 ENCOUNTER — Other Ambulatory Visit (HOSPITAL_COMMUNITY)
Admission: RE | Admit: 2018-08-07 | Discharge: 2018-08-07 | Disposition: A | Discharge status: Home / Self Care | Payer: 59 | Source: Ambulatory Visit | Attending: Psychiatry | Admitting: Psychiatry

## 2018-08-07 ENCOUNTER — Encounter (HOSPITAL_COMMUNITY)
Admission: RE | Admit: 2018-08-07 | Discharge: 2018-08-07 | Disposition: A | Discharge status: Home / Self Care | Payer: 59 | Source: Ambulatory Visit | Attending: Surgery | Admitting: Surgery

## 2018-08-07 DIAGNOSIS — E210 Primary hyperparathyroidism: Secondary | ICD-10-CM | POA: Insufficient documentation

## 2018-08-07 DIAGNOSIS — I10 Essential (primary) hypertension: Secondary | ICD-10-CM | POA: Diagnosis not present

## 2018-08-07 DIAGNOSIS — F329 Major depressive disorder, single episode, unspecified: Secondary | ICD-10-CM | POA: Diagnosis not present

## 2018-08-07 DIAGNOSIS — Z885 Allergy status to narcotic agent status: Secondary | ICD-10-CM | POA: Diagnosis not present

## 2018-08-07 DIAGNOSIS — Z79899 Other long term (current) drug therapy: Secondary | ICD-10-CM | POA: Diagnosis not present

## 2018-08-07 DIAGNOSIS — Z1159 Encounter for screening for other viral diseases: Secondary | ICD-10-CM | POA: Insufficient documentation

## 2018-08-07 DIAGNOSIS — Z01812 Encounter for preprocedural laboratory examination: Secondary | ICD-10-CM | POA: Insufficient documentation

## 2018-08-07 HISTORY — DX: Hyperparathyroidism, unspecified: E21.3

## 2018-08-07 HISTORY — DX: Other injury of unspecified body region, initial encounter: T14.8XXA

## 2018-08-07 HISTORY — DX: Pain in left leg: M79.605

## 2018-08-07 HISTORY — DX: Anemia, unspecified: D64.9

## 2018-08-07 HISTORY — DX: Other specified disorders of bone density and structure, unspecified site: M85.80

## 2018-08-07 LAB — BASIC METABOLIC PANEL
Anion gap: 6 (ref 5–15)
BUN: 16 mg/dL (ref 6–20)
CO2: 26 mmol/L (ref 22–32)
Calcium: 11.5 mg/dL — ABNORMAL HIGH (ref 8.9–10.3)
Chloride: 107 mmol/L (ref 98–111)
Creatinine, Ser: 0.75 mg/dL (ref 0.44–1.00)
GFR calc Af Amer: >60 mL/min (ref >60)
GFR calc non Af Amer: >60 mL/min (ref >60)
Glucose, Bld: 100 mg/dL — ABNORMAL HIGH (ref 70–99)
Potassium: 5.6 mmol/L — ABNORMAL HIGH (ref 3.5–5.1)
Sodium: 139 mmol/L (ref 135–145)

## 2018-08-07 LAB — CBC
HCT: 47.9 % — ABNORMAL HIGH (ref 36.0–46.0)
Hemoglobin: 15.5 g/dL — ABNORMAL HIGH (ref 12.0–15.0)
MCH: 30.2 pg (ref 26.0–34.0)
MCHC: 32.4 g/dL (ref 30.0–36.0)
MCV: 93.2 fL (ref 80.0–100.0)
Platelets: 203 10*3/uL (ref 150–400)
RBC: 5.14 MIL/uL — ABNORMAL HIGH (ref 3.87–5.11)
RDW: 12 % (ref 11.5–15.5)
WBC: 4.9 10*3/uL (ref 4.0–10.5)
nRBC: 0 % (ref 0.0–0.2)

## 2018-08-08 ENCOUNTER — Encounter (HOSPITAL_COMMUNITY): Payer: Self-pay | Admitting: Surgery

## 2018-08-08 ENCOUNTER — Other Ambulatory Visit (HOSPITAL_COMMUNITY)
Admission: RE | Admit: 2018-08-08 | Discharge: 2018-08-08 | Disposition: A | Discharge status: Home / Self Care | Payer: 59 | Source: Ambulatory Visit | Attending: Surgery | Admitting: Surgery

## 2018-08-08 DIAGNOSIS — E210 Primary hyperparathyroidism: Secondary | ICD-10-CM | POA: Diagnosis not present

## 2018-08-08 LAB — SARS CORONAVIRUS 2 (TAT 6-24 HRS)

## 2018-08-08 LAB — SARS CORONAVIRUS 2 BY RT PCR (HOSPITAL ORDER, PERFORMED IN ~~LOC~~ HOSPITAL LAB): SARS Coronavirus 2: NEGATIVE

## 2018-08-09 NOTE — Anesthesia Preprocedure Evaluation (Addendum)
Anesthesia Evaluation  Patient identified by MRN, date of birth, ID band Patient awake    Reviewed: Allergy & Precautions, NPO status , Patient's Chart, lab work & pertinent test results  History of Anesthesia Complications Negative for: history of anesthetic complications  Airway Mallampati: II  TM Distance: >3 FB Neck ROM: Full    Dental  (+) Upper Dentures, Dental Advisory Given   Pulmonary neg pulmonary ROS,    Pulmonary exam normal        Cardiovascular hypertension, Normal cardiovascular exam     Neuro/Psych negative neurological ROS  negative psych ROS   GI/Hepatic Neg liver ROS, PUD,   Endo/Other    Renal/GU negative Renal ROS  negative genitourinary   Musculoskeletal negative musculoskeletal ROS (+)   Abdominal   Peds negative pediatric ROS (+)  Hematology negative hematology ROS (+)   Anesthesia Other Findings   Reproductive/Obstetrics negative OB ROS                            Anesthesia Physical Anesthesia Plan  ASA: II  Anesthesia Plan: General   Post-op Pain Management:    Induction: Intravenous  PONV Risk Score and Plan: 4 or greater and Dexamethasone, Scopolamine patch - Pre-op, Diphenhydramine and Ondansetron  Airway Management Planned: Oral ETT  Additional Equipment:   Intra-op Plan:   Post-operative Plan: Extubation in OR  Informed Consent: I have reviewed the patients History and Physical, chart, labs and discussed the procedure including the risks, benefits and alternatives for the proposed anesthesia with the patient or authorized representative who has indicated his/her understanding and acceptance.     Dental advisory given  Plan Discussed with: CRNA and Anesthesiologist  Anesthesia Plan Comments:        Anesthesia Quick Evaluation

## 2018-08-10 ENCOUNTER — Ambulatory Visit (HOSPITAL_COMMUNITY)
Admission: RE | Admit: 2018-08-10 | Discharge: 2018-08-10 | Disposition: A | Discharge status: Home / Self Care | Payer: 59 | Attending: Surgery | Admitting: Surgery

## 2018-08-10 ENCOUNTER — Encounter (HOSPITAL_COMMUNITY)
Admission: RE | Disposition: A | Discharge status: Home / Self Care | Payer: Self-pay | Source: Home / Self Care | Attending: Surgery

## 2018-08-10 ENCOUNTER — Ambulatory Visit (HOSPITAL_COMMUNITY): Payer: 59 | Admitting: Anesthesiology

## 2018-08-10 ENCOUNTER — Ambulatory Visit (HOSPITAL_COMMUNITY): Payer: 59 | Admitting: Physician Assistant

## 2018-08-10 ENCOUNTER — Other Ambulatory Visit: Payer: Self-pay

## 2018-08-10 ENCOUNTER — Encounter (HOSPITAL_COMMUNITY): Payer: Self-pay | Admitting: Anesthesiology

## 2018-08-10 DIAGNOSIS — I10 Essential (primary) hypertension: Secondary | ICD-10-CM | POA: Insufficient documentation

## 2018-08-10 DIAGNOSIS — Z79899 Other long term (current) drug therapy: Secondary | ICD-10-CM | POA: Insufficient documentation

## 2018-08-10 DIAGNOSIS — E210 Primary hyperparathyroidism: Secondary | ICD-10-CM | POA: Insufficient documentation

## 2018-08-10 DIAGNOSIS — F329 Major depressive disorder, single episode, unspecified: Secondary | ICD-10-CM | POA: Insufficient documentation

## 2018-08-10 DIAGNOSIS — Z885 Allergy status to narcotic agent status: Secondary | ICD-10-CM | POA: Insufficient documentation

## 2018-08-10 DIAGNOSIS — Z1159 Encounter for screening for other viral diseases: Secondary | ICD-10-CM | POA: Insufficient documentation

## 2018-08-10 HISTORY — PX: PARATHYROIDECTOMY: SHX19

## 2018-08-10 SURGERY — PARATHYROIDECTOMY
Anesthesia: General | Site: Neck | Laterality: Right

## 2018-08-10 MED ORDER — BUPIVACAINE-EPINEPHRINE (PF) 0.25% -1:200000 IJ SOLN
INTRAMUSCULAR | Status: AC
  Filled 2018-08-10: qty 30

## 2018-08-10 MED ORDER — DEXAMETHASONE SODIUM PHOSPHATE 10 MG/ML IJ SOLN
INTRAMUSCULAR | Status: DC | PRN
  Administered 2018-08-10: 10 mg via INTRAVENOUS

## 2018-08-10 MED ORDER — 0.9 % SODIUM CHLORIDE (POUR BTL) OPTIME
TOPICAL | Status: DC | PRN
  Administered 2018-08-10: 1000 mL

## 2018-08-10 MED ORDER — FENTANYL CITRATE (PF) 250 MCG/5ML IJ SOLN
INTRAMUSCULAR | Status: AC
  Filled 2018-08-10: qty 5

## 2018-08-10 MED ORDER — CEFAZOLIN SODIUM-DEXTROSE 2-4 GM/100ML-% IV SOLN
2.0000 g | INTRAVENOUS | Status: AC
  Administered 2018-08-10: 2 g via INTRAVENOUS
  Filled 2018-08-10: qty 100

## 2018-08-10 MED ORDER — LIDOCAINE 2% (20 MG/ML) 5 ML SYRINGE
INTRAMUSCULAR | Status: AC
  Filled 2018-08-10: qty 5

## 2018-08-10 MED ORDER — MIDAZOLAM HCL 2 MG/2ML IJ SOLN
INTRAMUSCULAR | Status: DC | PRN
  Administered 2018-08-10: 2 mg via INTRAVENOUS

## 2018-08-10 MED ORDER — CHLORHEXIDINE GLUCONATE CLOTH 2 % EX PADS: 6.0000 | MEDICATED_PAD | Freq: Once | CUTANEOUS | Status: DC

## 2018-08-10 MED ORDER — EPHEDRINE 5 MG/ML INJ
INTRAVENOUS | Status: AC
  Filled 2018-08-10: qty 10

## 2018-08-10 MED ORDER — BUPIVACAINE HCL (PF) 0.25 % IJ SOLN
INTRAMUSCULAR | Status: AC
  Filled 2018-08-10: qty 30

## 2018-08-10 MED ORDER — EPHEDRINE SULFATE-NACL 50-0.9 MG/10ML-% IV SOSY
PREFILLED_SYRINGE | INTRAVENOUS | Status: DC | PRN
  Administered 2018-08-10 (×3): 10 mg via INTRAVENOUS

## 2018-08-10 MED ORDER — ONDANSETRON HCL 4 MG/2ML IJ SOLN
INTRAMUSCULAR | Status: AC
  Filled 2018-08-10: qty 2

## 2018-08-10 MED ORDER — PHENYLEPHRINE 40 MCG/ML (10ML) SYRINGE FOR IV PUSH (FOR BLOOD PRESSURE SUPPORT)
PREFILLED_SYRINGE | INTRAVENOUS | Status: DC | PRN
  Administered 2018-08-10: 80 ug via INTRAVENOUS
  Administered 2018-08-10: 120 ug via INTRAVENOUS

## 2018-08-10 MED ORDER — MIDAZOLAM HCL 2 MG/2ML IJ SOLN
INTRAMUSCULAR | Status: AC
  Filled 2018-08-10: qty 2

## 2018-08-10 MED ORDER — TRAMADOL HCL 50 MG PO TABS: 50.0000 mg | ORAL_TABLET | Freq: Four times a day (QID) | ORAL | 0 refills | Status: AC | PRN

## 2018-08-10 MED ORDER — DIPHENHYDRAMINE HCL 50 MG/ML IJ SOLN
INTRAMUSCULAR | Status: AC
  Filled 2018-08-10: qty 1

## 2018-08-10 MED ORDER — LIDOCAINE 2% (20 MG/ML) 5 ML SYRINGE
INTRAMUSCULAR | Status: DC | PRN
  Administered 2018-08-10: 60 mg via INTRAVENOUS

## 2018-08-10 MED ORDER — TRAMADOL HCL 50 MG PO TABS
50.0000 mg | ORAL_TABLET | Freq: Once | ORAL | Status: AC
  Administered 2018-08-10: 50 mg via ORAL

## 2018-08-10 MED ORDER — PROPOFOL 10 MG/ML IV BOLUS
INTRAVENOUS | Status: AC
  Filled 2018-08-10: qty 20

## 2018-08-10 MED ORDER — SCOPOLAMINE 1 MG/3DAYS TD PT72
1.0000 | MEDICATED_PATCH | TRANSDERMAL | Status: DC
  Administered 2018-08-10: 1.5 mg via TRANSDERMAL
  Filled 2018-08-10: qty 1

## 2018-08-10 MED ORDER — TRAMADOL HCL 50 MG PO TABS
ORAL_TABLET | ORAL | Status: AC
  Filled 2018-08-10: qty 1

## 2018-08-10 MED ORDER — SUGAMMADEX SODIUM 200 MG/2ML IV SOLN
INTRAVENOUS | Status: AC
  Filled 2018-08-10: qty 2

## 2018-08-10 MED ORDER — BUPIVACAINE HCL 0.25 % IJ SOLN
INTRAMUSCULAR | Status: DC | PRN
  Administered 2018-08-10: 10 mL

## 2018-08-10 MED ORDER — DEXAMETHASONE SODIUM PHOSPHATE 10 MG/ML IJ SOLN
INTRAMUSCULAR | Status: AC
  Filled 2018-08-10: qty 1

## 2018-08-10 MED ORDER — SUGAMMADEX SODIUM 200 MG/2ML IV SOLN
INTRAVENOUS | Status: DC | PRN
  Administered 2018-08-10: 200 mg via INTRAVENOUS

## 2018-08-10 MED ORDER — ONDANSETRON HCL 4 MG/2ML IJ SOLN
INTRAMUSCULAR | Status: DC | PRN
  Administered 2018-08-10: 4 mg via INTRAVENOUS

## 2018-08-10 MED ORDER — FENTANYL CITRATE (PF) 250 MCG/5ML IJ SOLN
INTRAMUSCULAR | Status: DC | PRN
  Administered 2018-08-10: 100 ug via INTRAVENOUS

## 2018-08-10 MED ORDER — PROPOFOL 10 MG/ML IV BOLUS
INTRAVENOUS | Status: DC | PRN
  Administered 2018-08-10: 140 mg via INTRAVENOUS

## 2018-08-10 MED ORDER — PROMETHAZINE HCL 25 MG/ML IJ SOLN: 6.2500 mg | INTRAMUSCULAR | Status: DC | PRN

## 2018-08-10 MED ORDER — ROCURONIUM BROMIDE 10 MG/ML (PF) SYRINGE
PREFILLED_SYRINGE | INTRAVENOUS | Status: DC | PRN
  Administered 2018-08-10: 40 mg via INTRAVENOUS

## 2018-08-10 MED ORDER — FENTANYL CITRATE (PF) 100 MCG/2ML IJ SOLN
INTRAMUSCULAR | Status: AC
  Filled 2018-08-10: qty 2

## 2018-08-10 MED ORDER — ROCURONIUM BROMIDE 10 MG/ML (PF) SYRINGE
PREFILLED_SYRINGE | INTRAVENOUS | Status: AC
  Filled 2018-08-10: qty 10

## 2018-08-10 MED ORDER — SUCCINYLCHOLINE CHLORIDE 200 MG/10ML IV SOSY
PREFILLED_SYRINGE | INTRAVENOUS | Status: AC
  Filled 2018-08-10: qty 10

## 2018-08-10 MED ORDER — FENTANYL CITRATE (PF) 100 MCG/2ML IJ SOLN
25.0000 ug | INTRAMUSCULAR | Status: DC | PRN
  Administered 2018-08-10: 25 ug via INTRAVENOUS
  Administered 2018-08-10: 50 ug via INTRAVENOUS

## 2018-08-10 MED ORDER — DIPHENHYDRAMINE HCL 50 MG/ML IJ SOLN
INTRAMUSCULAR | Status: DC | PRN
  Administered 2018-08-10: 12.5 mg via INTRAVENOUS

## 2018-08-10 MED ORDER — LACTATED RINGERS IV SOLN
INTRAVENOUS | Status: DC
  Administered 2018-08-10 (×2): via INTRAVENOUS

## 2018-08-10 MED ORDER — LIDOCAINE HCL 4 % MT SOLN
OROMUCOSAL | Status: DC | PRN
  Administered 2018-08-10: 4 mL via TOPICAL

## 2018-08-10 MED ORDER — SUCCINYLCHOLINE CHLORIDE 200 MG/10ML IV SOSY
PREFILLED_SYRINGE | INTRAVENOUS | Status: DC | PRN
  Administered 2018-08-10: 120 mg via INTRAVENOUS

## 2018-08-10 SURGICAL SUPPLY — 30 items
ATTRACTOMAT 16X20 MAGNETIC DRP (DRAPES) ×2 IMPLANT
BLADE SURG 15 STRL LF DISP TIS (BLADE) ×1 IMPLANT
BLADE SURG 15 STRL SS (BLADE) ×1
CHLORAPREP W/TINT 26 (MISCELLANEOUS) ×2 IMPLANT
CLIP VESOCCLUDE MED 6/CT (CLIP) ×4 IMPLANT
CLIP VESOCCLUDE SM WIDE 6/CT (CLIP) ×4 IMPLANT
COVER SURGICAL LIGHT HANDLE (MISCELLANEOUS) ×2 IMPLANT
COVER WAND RF STERILE (DRAPES) ×2 IMPLANT
DERMABOND ADVANCED (GAUZE/BANDAGES/DRESSINGS) ×1
DERMABOND ADVANCED .7 DNX12 (GAUZE/BANDAGES/DRESSINGS) IMPLANT
DRAPE LAPAROTOMY T 98X78 PEDS (DRAPES) ×2 IMPLANT
ELECT PENCIL ROCKER SW 15FT (MISCELLANEOUS) ×2 IMPLANT
ELECT REM PT RETURN 15FT ADLT (MISCELLANEOUS) ×2 IMPLANT
GAUZE 4X4 16PLY RFD (DISPOSABLE) ×2 IMPLANT
GLOVE SURG ORTHO 8.0 STRL STRW (GLOVE) ×2 IMPLANT
GOWN STRL REUS W/TWL XL LVL3 (GOWN DISPOSABLE) ×6 IMPLANT
HEMOSTAT SURGICEL 2X4 FIBR (HEMOSTASIS) IMPLANT
ILLUMINATOR WAVEGUIDE N/F (MISCELLANEOUS) IMPLANT
KIT BASIN OR (CUSTOM PROCEDURE TRAY) ×2 IMPLANT
KIT TURNOVER KIT A (KITS) IMPLANT
NDL HYPO 25X1 1.5 SAFETY (NEEDLE) ×1 IMPLANT
NEEDLE HYPO 25X1 1.5 SAFETY (NEEDLE) ×2 IMPLANT
PACK BASIC VI WITH GOWN DISP (CUSTOM PROCEDURE TRAY) ×2 IMPLANT
SUT MNCRL AB 4-0 PS2 18 (SUTURE) ×2 IMPLANT
SUT VIC AB 3-0 SH 18 (SUTURE) ×2 IMPLANT
SYR BULB IRRIGATION 50ML (SYRINGE) ×2 IMPLANT
SYR CONTROL 10ML LL (SYRINGE) ×2 IMPLANT
TOWEL OR 17X26 10 PK STRL BLUE (TOWEL DISPOSABLE) ×2 IMPLANT
TOWEL OR NON WOVEN STRL DISP B (DISPOSABLE) ×2 IMPLANT
TUBING CONNECTING 10 (TUBING) ×2 IMPLANT

## 2018-08-10 NOTE — Anesthesia Procedure Notes (Signed)
Procedure Name: Intubation Date/Time: 08/10/2018 8:19 AM Performed by: Sharlette Dense, CRNA Patient Re-evaluated:Patient Re-evaluated prior to induction Oxygen Delivery Method: Circle system utilized Preoxygenation: Pre-oxygenation with 100% oxygen Induction Type: IV induction and Rapid sequence Laryngoscope Size: Miller and 2 Grade View: Grade I Tube type: Reinforced Tube size: 7.5 mm Number of attempts: 1 Airway Equipment and Method: Stylet Placement Confirmation: ETT inserted through vocal cords under direct vision,  positive ETCO2 and breath sounds checked- equal and bilateral Secured at: 21 cm Tube secured with: Tape Dental Injury: Teeth and Oropharynx as per pre-operative assessment

## 2018-08-10 NOTE — Discharge Instructions (Signed)
General Anesthesia, Adult, Care After  This sheet gives you information about how to care for yourself after your procedure. Your health care provider may also give you more specific instructions. If you have problems or questions, contact your health care provider.  What can I expect after the procedure?  After the procedure, the following side effects are common:  Pain or discomfort at the IV site.  Nausea.  Vomiting.  Sore throat.  Trouble concentrating.  Feeling cold or chills.  Weak or tired.  Sleepiness and fatigue.  Soreness and body aches. These side effects can affect parts of the body that were not involved in surgery.  Follow these instructions at home:    For at least 24 hours after the procedure:  Have a responsible adult stay with you. It is important to have someone help care for you until you are awake and alert.  Rest as needed.  Do not:  Participate in activities in which you could fall or become injured.  Drive.  Use heavy machinery.  Drink alcohol.  Take sleeping pills or medicines that cause drowsiness.  Make important decisions or sign legal documents.  Take care of children on your own.  Eating and drinking  Follow any instructions from your health care provider about eating or drinking restrictions.  When you feel hungry, start by eating small amounts of foods that are soft and easy to digest (bland), such as toast. Gradually return to your regular diet.  Drink enough fluid to keep your urine pale yellow.  If you vomit, rehydrate by drinking water, juice, or clear broth.  General instructions  If you have sleep apnea, surgery and certain medicines can increase your risk for breathing problems. Follow instructions from your health care provider about wearing your sleep device:  Anytime you are sleeping, including during daytime naps.  While taking prescription pain medicines, sleeping medicines, or medicines that make you drowsy.  Return to your normal activities as told by your health care  provider. Ask your health care provider what activities are safe for you.  Take over-the-counter and prescription medicines only as told by your health care provider.  If you smoke, do not smoke without supervision.  Keep all follow-up visits as told by your health care provider. This is important.  Contact a health care provider if:  You have nausea or vomiting that does not get better with medicine.  You cannot eat or drink without vomiting.  You have pain that does not get better with medicine.  You are unable to pass urine.  You develop a skin rash.  You have a fever.  You have redness around your IV site that gets worse.  Get help right away if:  You have difficulty breathing.  You have chest pain.  You have blood in your urine or stool, or you vomit blood.  Summary  After the procedure, it is common to have a sore throat or nausea. It is also common to feel tired.  Have a responsible adult stay with you for the first 24 hours after general anesthesia. It is important to have someone help care for you until you are awake and alert.  When you feel hungry, start by eating small amounts of foods that are soft and easy to digest (bland), such as toast. Gradually return to your regular diet.  Drink enough fluid to keep your urine pale yellow.  Return to your normal activities as told by your health care provider. Ask your health care   provider what activities are safe for you.  This information is not intended to replace advice given to you by your health care provider. Make sure you discuss any questions you have with your health care provider.  Document Released: 05/10/2000 Document Revised: 09/17/2016 Document Reviewed: 09/17/2016  Elsevier Interactive Patient Education  2019 [employer]

## 2018-08-10 NOTE — Op Note (Signed)
OPERATIVE REPORT - PARATHYROIDECTOMY  Preoperative diagnosis: Primary hyperparathyroidism  Postop diagnosis: Same  Procedure: Right inferior minimally invasive parathyroidectomy  Surgeon:  [name], MD  Anesthesia: General endotracheal  Estimated blood loss: Minimal  Preparation: ChloraPrep  Indications: Patient is referred by Dr. Larna Daughters Avernini for surgical evaluation and management of primary hyperparathyroidism. Patient had been noted by her primary care physician to have elevated serum calcium levels. Most recent levels have ranged from 11.6-11.8. Intact PTH level was also elevated at 110-117. 24-hour urine collection for calcium was found to be elevated at 366. 25-hydroxy vitamin D level was low at 21 and the patient is taking vitamin D supplementation. Bone density scan was obtained by her endocrinologist and showed evidence of osteopenia. Patient was referred for thyroid ultrasound in the summer of 2019 which showed an enlarged thyroid gland with a stable right superior 1.5 cm thyroid nodule. There was no evidence of parathyroid adenoma. Patient underwent nuclear medicine parathyroid scan on March 13, 2018. This likewise demonstrated no evidence of parathyroid adenoma.  Subsequent 4D-CT scan localized a right inferior parathyroid adenoma.  Patient now comes to surgery for parathyroidectomy.  Procedure: The patient was prepared in the pre-operative holding area. The patient was brought to the operating room and placed in a supine position on the operating room table. Following administration of general anesthesia, the patient was positioned and then prepped and draped in the usual strict aseptic fashion. After ascertaining that an adequate level of anesthesia been achieved, a neck incision was made with a #15 blade. Dissection was carried through subcutaneous tissues and platysma. Hemostasis was obtained with the electrocautery. Skin flaps were developed circumferentially and  a Weitlander retractor was placed for exposure.  Strap muscles were incised in the midline. Strap muscles were reflected lateralley exposing the thyroid lobe. With gentle blunt dissection the thyroid lobe was mobilized.  Dissection was carried through adipose tissue and an enlarged parathyroid gland was identified. It was gently mobilized. Vascular structures were divided between small ligaclips. Care was taken to avoid the recurrent laryngeal nerve and the esophagus. The parathyroid gland was completely excised. It was submitted to pathology where frozen section confirmed parathyroid tissue with a small focus of hypercellular parathyroid tissue.  Additional nodules from this area were excised and submitted to pathology.  They were consistent with benign lymph nodes.  Neck was irrigated with warm saline and good hemostasis was noted. Fibrillar was placed in the operative field. Strap muscles were approximated in the midline with interrupted 3-0 Vicryl sutures. Platysma was closed with interrupted 3-0 Vicryl sutures. Marcaine was infiltrated circumferentially. Skin was closed with a running 4-0 Monocryl subcuticular suture. Wound was washed and dried and Dermabond was applied. Patient was awakened from anesthesia and brought to the recovery room. The patient tolerated the procedure well.   [name], MD [facility] Office: [phone]

## 2018-08-10 NOTE — Anesthesia Postprocedure Evaluation (Signed)
Anesthesia Post Note  Patient: Cynthia Hoover  Procedure(s) Performed: RIGHT INFERIOR PARATHYROIDECTOMY (Right Neck)     Patient location during evaluation: PACU Anesthesia Type: General Level of consciousness: sedated Pain management: pain level controlled Vital Signs Assessment: post-procedure vital signs reviewed and stable Respiratory status: spontaneous breathing and respiratory function stable Cardiovascular status: stable Postop Assessment: no apparent nausea or vomiting Anesthetic complications: no    Last Vitals:  Vitals:   08/10/18 1000 08/10/18 1020  BP: (!) 143/89 (!) 156/88  Pulse: 67 70  Resp: 12 16  Temp: 36.4 C 36.6 C  SpO2: 95% 98%    Last Pain:  Vitals:   08/10/18 1020  TempSrc:   PainSc: 6                  Ezana Hubbert DANIEL

## 2018-08-10 NOTE — Interval H&P Note (Signed)
History and Physical Interval Note:  08/10/2018 8:00 AM  Cynthia Hoover  has presented today for surgery, with the diagnosis of PRIMARY HYPERPARATHYROIDISM.  The various methods of treatment have been discussed with the patient and family. After consideration of risks, benefits and other options for treatment, the patient has consented to    Procedure(s): RIGHT INFERIOR PARATHYROIDECTOMY (Right) as a surgical intervention.    The patient's history has been reviewed, patient examined, no change in status, stable for surgery.  I have reviewed the patient's chart and labs.  Questions were answered to the patient's satisfaction.    [place], [place] [facility] Office: [place]

## 2018-08-10 NOTE — Transfer of Care (Signed)
Immediate Anesthesia Transfer of Care Note  Patient: Cynthia Hoover  Procedure(s) Performed: RIGHT INFERIOR PARATHYROIDECTOMY (Right Neck)  Patient Location: PACU  Anesthesia Type:General  Level of Consciousness: drowsy  Airway & Oxygen Therapy: Patient Spontanous Breathing and Patient connected to face mask oxygen  Post-op Assessment: Report given to RN and Post -op Vital signs reviewed and stable  Post vital signs: Reviewed and stable  Last Vitals:  Vitals Value Taken Time  BP 153/86 08/10/18 0931  Temp    Pulse 77 08/10/18 0932  Resp 11 08/10/18 0932  SpO2 100 % 08/10/18 0932  Vitals shown include unvalidated device data.  Last Pain:  Vitals:   08/10/18 0604  TempSrc: Oral      Patients Stated Pain Goal: 4 (63/01/60 1093)  Complications: No apparent anesthesia complications

## 2018-08-11 ENCOUNTER — Encounter (HOSPITAL_COMMUNITY): Payer: Self-pay | Admitting: Surgery

## 2018-08-15 ENCOUNTER — Other Ambulatory Visit (HOSPITAL_COMMUNITY): Payer: Self-pay | Admitting: Surgery

## 2018-08-15 ENCOUNTER — Encounter (HOSPITAL_COMMUNITY): Payer: 59

## 2018-08-15 ENCOUNTER — Ambulatory Visit (HOSPITAL_COMMUNITY): Payer: 59

## 2018-08-15 DIAGNOSIS — M79605 Pain in left leg: Secondary | ICD-10-CM

## 2018-08-15 DIAGNOSIS — M7989 Other specified soft tissue disorders: Secondary | ICD-10-CM

## 2018-08-16 ENCOUNTER — Encounter (HOSPITAL_COMMUNITY): Payer: Self-pay

## 2018-08-16 ENCOUNTER — Emergency Department (HOSPITAL_COMMUNITY)
Admission: EM | Admit: 2018-08-16 | Discharge: 2018-08-16 | Disposition: A | Discharge status: Home / Self Care | Payer: 59 | Attending: Emergency Medicine | Admitting: Emergency Medicine

## 2018-08-16 ENCOUNTER — Other Ambulatory Visit: Payer: Self-pay

## 2018-08-16 ENCOUNTER — Emergency Department (HOSPITAL_COMMUNITY): Payer: 59

## 2018-08-16 ENCOUNTER — Encounter (HOSPITAL_COMMUNITY): Payer: 59

## 2018-08-16 ENCOUNTER — Emergency Department (HOSPITAL_BASED_OUTPATIENT_CLINIC_OR_DEPARTMENT_OTHER): Payer: 59

## 2018-08-16 ENCOUNTER — Emergency Department (HOSPITAL_COMMUNITY)
Admission: RE | Admit: 2018-08-16 | Discharge: 2018-08-16 | Disposition: A | Discharge status: Home / Self Care | Payer: 59 | Source: Ambulatory Visit | Attending: Surgery | Admitting: Surgery

## 2018-08-16 DIAGNOSIS — R072 Precordial pain: Secondary | ICD-10-CM | POA: Insufficient documentation

## 2018-08-16 DIAGNOSIS — R2243 Localized swelling, mass and lump, lower limb, bilateral: Secondary | ICD-10-CM | POA: Diagnosis not present

## 2018-08-16 DIAGNOSIS — I10 Essential (primary) hypertension: Secondary | ICD-10-CM | POA: Diagnosis not present

## 2018-08-16 DIAGNOSIS — R52 Pain, unspecified: Secondary | ICD-10-CM

## 2018-08-16 DIAGNOSIS — M79605 Pain in left leg: Secondary | ICD-10-CM | POA: Insufficient documentation

## 2018-08-16 DIAGNOSIS — M7989 Other specified soft tissue disorders: Secondary | ICD-10-CM

## 2018-08-16 DIAGNOSIS — Z79899 Other long term (current) drug therapy: Secondary | ICD-10-CM | POA: Diagnosis not present

## 2018-08-16 LAB — CBC
HCT: 46.8 % — ABNORMAL HIGH (ref 36.0–46.0)
Hemoglobin: 15.7 g/dL — ABNORMAL HIGH (ref 12.0–15.0)
MCH: 30.3 pg (ref 26.0–34.0)
MCHC: 33.5 g/dL (ref 30.0–36.0)
MCV: 90.3 fL (ref 80.0–100.0)
Platelets: 235 10*3/uL (ref 150–400)
RBC: 5.18 MIL/uL — ABNORMAL HIGH (ref 3.87–5.11)
RDW: 11.9 % (ref 11.5–15.5)
WBC: 4.4 10*3/uL (ref 4.0–10.5)
nRBC: 0 % (ref 0.0–0.2)

## 2018-08-16 LAB — BASIC METABOLIC PANEL
Anion gap: 8 (ref 5–15)
BUN: 12 mg/dL (ref 6–20)
CO2: 23 mmol/L (ref 22–32)
Calcium: 11.6 mg/dL — ABNORMAL HIGH (ref 8.9–10.3)
Chloride: 108 mmol/L (ref 98–111)
Creatinine, Ser: 0.64 mg/dL (ref 0.44–1.00)
GFR calc Af Amer: >60 mL/min (ref >60)
GFR calc non Af Amer: >60 mL/min (ref >60)
Glucose, Bld: 110 mg/dL — ABNORMAL HIGH (ref 70–99)
Potassium: 4.1 mmol/L (ref 3.5–5.1)
Sodium: 139 mmol/L (ref 135–145)

## 2018-08-16 LAB — I-STAT BETA HCG BLOOD, ED (MC, WL, AP ONLY): I-stat hCG, quantitative: <5 m[IU]/mL (ref <5)

## 2018-08-16 LAB — TROPONIN I (HIGH SENSITIVITY): Troponin I (High Sensitivity): <2 ng/L (ref <18)

## 2018-08-16 MED ORDER — SODIUM CHLORIDE 0.9% FLUSH: 3.0000 mL | Freq: Once | INTRAVENOUS | Status: DC

## 2018-08-16 MED ORDER — HYDROCODONE-ACETAMINOPHEN 5-325 MG PO TABS: 1.0000 | ORAL_TABLET | ORAL | 0 refills | Status: AC | PRN

## 2018-08-16 NOTE — ED Notes (Signed)
US at bedside

## 2018-08-16 NOTE — ED Notes (Signed)
Patient back from x-ray 

## 2018-08-16 NOTE — ED Notes (Signed)
Patient transported to x-ray. ?

## 2018-08-16 NOTE — Progress Notes (Signed)
Left lower extremity venous duplex completed. Preliminary results in Chart review CV Proc. [place] Norleen Xie,RVS  08/16/2018, 10:59 AM

## 2018-08-16 NOTE — ED Triage Notes (Signed)
Pt had parathyroid surgery last week. Pt arrived at hospital today for eval of left leg. However, on arrival, pt c/o chest pain over her left breast that comes and goes and was sent over by Dr. Harlow Asa.

## 2018-08-17 ENCOUNTER — Encounter (HOSPITAL_COMMUNITY): Payer: 59

## 2018-08-17 ENCOUNTER — Ambulatory Visit (INDEPENDENT_AMBULATORY_CARE_PROVIDER_SITE_OTHER): Payer: 59 | Admitting: Family Medicine

## 2018-08-17 ENCOUNTER — Encounter: Payer: Self-pay | Admitting: Family Medicine

## 2018-08-17 DIAGNOSIS — M79605 Pain in left leg: Secondary | ICD-10-CM

## 2018-08-17 MED ORDER — METHYLPREDNISOLONE 4 MG PO TBPK: ORAL_TABLET | ORAL | 0 refills | Status: AC

## 2018-08-17 MED ORDER — TIZANIDINE HCL 2 MG PO TABS: 2.0000 mg | ORAL_TABLET | Freq: Four times a day (QID) | ORAL | 1 refills | Status: AC | PRN

## 2018-08-17 NOTE — Progress Notes (Signed)
Office Visit Note   Patient: Cynthia Hoover           Date of Birth: 05/05/1965           MRN: [id number] Visit Date: 08/17/2018 Requested by: Cynthia Hoover, [place],  [place] [address] PCP: Cynthia Lass, MD  Subjective: Chief Complaint  Patient presents with  . Left Leg - Pain    Shooting pain down the left leg with weightbearing, down to foot, with numbness/tingling in the top of the foot. Started 2 weeks. NKI.    HPI: She is here with left leg pain.  Symptoms started about 2 weeks ago with pain in the anterior lateral left hip.  It was severe pain when walking.  Then last week she had parathyroid surgery.  Afterward she no longer had the pain in the anterior lateral hip, but has had severe pain below the knee when trying to stand upright.  Pain goes away when sitting and lying down.  She has not had any bowel or bladder dysfunction.  She is not having any low back pain.  She had a Doppler study which was negative for DVT, and some x-rays of her tibia/fibula which I reviewed and are unremarkable.               ROS: No fevers or chills.  All other systems were reviewed and are negative.  Objective: Vital Signs: LMP  (LMP Unknown) Comment: negative HCg in Ed today 10/16/2017  Physical Exam:  General:  Alert and oriented, in no acute distress. Pulm:  Breathing unlabored. Psy:  Normal mood, congruent affect. Skin: No rash on her skin. Left leg: No pain with internal/external hip rotation.  Straight leg raise is negative.  Lower extremity strength and reflexes are normal.  No tenderness to palpation of the lower leg.  Lumbar spine is nontender to palpation, no pain in the sciatic notch.  Imaging: None today.  Assessment & Plan: 1.  Left leg pain, suspicious for lumbar foraminal stenosis. -Trial of Medrol Dosepak, Zanaflex as needed.  Physical therapy.  If no improvement, lumbar x-rays and MRI scan.     Procedures: No procedures performed  No notes on  file     PMFS History: Patient Active Problem List   Diagnosis Date Noted  . Hyperparathyroidism, primary ([place]) 07/11/2018  . Memory loss 11/15/2017  . Colloid cyst of brain ([place]) 11/15/2017  . Vitamin B12 deficiency 11/15/2017  . Marginal ulcer 10/16/2017  . Open wound of right elbow 02/05/2016   Past Medical History:  Diagnosis Date  . Anemia    hx   . Anxiety   . Compound fracture    L1 , lumbar , required no surgical intervention , just wore a back brace until it resolved   . Cyst of brain   . Hyperparathyroidism ([place])   . Hypertension    was on blood pressure medications prior to bariatric surgery , taken off after losing over a 100 pounds,   . Left leg pain    onset after cleaning up the barn on saturday 08-05-2018; has tried tylenol with some releif, reports it is better today but notices the pain is less wehn she hunches her back forward    . Osteopenia   . Peptic ulcer   . Vision abnormalities    reports blurriness in right eye due to an eye condition , unable to provide name of eye condition     Family History  Adopted: Yes  Family history unknown: Yes    Past Surgical History:  Procedure Laterality Date  . CESAREAN SECTION  1986  . PARATHYROIDECTOMY Right 08/10/2018   Procedure: RIGHT INFERIOR PARATHYROIDECTOMY;  Surgeon: Cynthia Gemma, MD;  Location: [facility];  Service: General;  Laterality: Right;   Social History   Occupational History  . Not on file  Tobacco Use  . Smoking status: Never Smoker  . Smokeless tobacco: Never Used  Substance and Sexual Activity  . Alcohol use: No    Comment: seldom   . Drug use: No  . Sexual activity: Yes

## 2018-08-19 NOTE — ED Provider Notes (Signed)
[facility] DEPT Provider Note   CSN: [id number] Arrival date & time: 08/16/18  4098     History   Chief Complaint Chief Complaint  Patient presents with  . Chest Pain    HPI Elyana Solazzo is a 53 y.o. female.     HPI 53 year old female presents the emergency department with mild swelling discomfort in her left leg.  She underwent parathyroid surgery last week and was referred to the emergency department for further evaluation given her leg pain and swelling.  She also reported transient discomfort in her left chest without associated shortness of breath.  No active chest pain or discomfort at this time.  No history of DVT or pulmonary embolism.  No history of ACS.  No other new medications.  No fevers or chills.  No cough or congestion.  Symptoms are mild to moderate in severity. Past Medical History:  Diagnosis Date  . Anemia    hx   . Anxiety   . Compound fracture    L1 , lumbar , required no surgical intervention , just wore a back brace until it resolved   . Cyst of brain   . Hyperparathyroidism ([place])   . Hypertension    was on blood pressure medications prior to bariatric surgery , taken off after losing over a 100 pounds,   . Left leg pain    onset after cleaning up the barn on saturday 08-05-2018; has tried tylenol with some releif, reports it is better today but notices the pain is less wehn she hunches her back forward    . Osteopenia   . Peptic ulcer   . Vision abnormalities    reports blurriness in right eye due to an eye condition , unable to provide name of eye condition     Patient Active Problem List   Diagnosis Date Noted  . Hyperparathyroidism, primary ([place]) 07/11/2018  . Memory loss 11/15/2017  . Colloid cyst of brain ([place]) 11/15/2017  . Vitamin B12 deficiency 11/15/2017  . Marginal ulcer 10/16/2017  . Open wound of right elbow 02/05/2016    Past Surgical History:  Procedure Laterality Date  . CESAREAN SECTION   1986  . PARATHYROIDECTOMY Right 08/10/2018   Procedure: RIGHT INFERIOR PARATHYROIDECTOMY;  Surgeon: [name], MD;  Location: [facility];  Service: General;  Laterality: Right;     OB History   No obstetric history on file.      Home Medications    Prior to Admission medications   Medication Sig Start Date End Date Taking? Authorizing Provider  Acetaminophen (TYLENOL) 325 MG CAPS Take 650 mg by mouth every 6 (six) hours as needed (pain).    Yes [provider]  buPROPion (WELLBUTRIN XL) 300 MG 24 hr tablet Take 300 mg by mouth daily. 08/02/17  Yes [provider]  Cyanocobalamin (VITAMIN B-12 PO) Take 2,500 mcg by mouth daily.    Yes [provider]  IRON PO Take 65 mg by mouth daily.    Yes [provider]  Vitamin D, Cholecalciferol, 25 MCG (1000 UT) TABS Take 1,000 Units by mouth daily.    Yes [provider]  baclofen (LIORESAL) 10 MG tablet Take 1 tablet (10 mg total) by mouth 3 (three) times daily. Patient not taking: Reported on 07/31/2018 04/24/18   Evelina Dun A, FNP  HYDROcodone-acetaminophen (NORCO/VICODIN) 5-325 MG tablet Take 1 tablet by mouth every 4 (four) hours as needed for moderate pain. 08/16/18   Jola Schmidt, MD  methylPREDNISolone (MEDROL  DOSEPAK) 4 MG TBPK tablet As directed for 6 days. 08/17/18   Hilts, Legrand Como, MD  naproxen (NAPROSYN) 500 MG tablet Take 1 tablet (500 mg total) by mouth 2 (two) times daily with a meal. Patient not taking: Reported on 07/31/2018 04/24/18   Sharion Balloon, FNP  tiZANidine (ZANAFLEX) 2 MG tablet Take 1-2 tablets (2-4 mg total) by mouth every 6 (six) hours as needed for muscle spasms. 08/17/18   Hilts, Legrand Como, MD  traMADol (ULTRAM) 50 MG tablet Take 1-2 tablets (50-100 mg total) by mouth every 6 (six) hours as needed. Patient not taking: Reported on 08/16/2018 08/10/18   [name], MD    Family History Family History  Adopted: Yes  Family history unknown: Yes    Social History Social  History   Tobacco Use  . Smoking status: Never Smoker  . Smokeless tobacco: Never Used  Substance Use Topics  . Alcohol use: No    Comment: seldom   . Drug use: No     Allergies   Morphine and Codeine   Review of Systems Review of Systems  All other systems reviewed and are negative.    Physical Exam Updated Vital Signs BP 134/84 (BP Location: Right Arm)   Pulse 72   Temp 98.2 F (36.8 C) (Oral)   Resp 18   Ht 5\' 8"  (1.727 m)   Wt 89.4 kg   LMP  (LMP Unknown) Comment: negative HCg in Ed today 10/16/2017  SpO2 98%   BMI 29.95 kg/m   Physical Exam Vitals signs and nursing note reviewed.  Constitutional:      General: She is not in acute distress.    Appearance: She is well-developed.  HENT:     Head: Normocephalic and atraumatic.  Neck:     Musculoskeletal: Normal range of motion.  Cardiovascular:     Rate and Rhythm: Normal rate and regular rhythm.     Heart sounds: Normal heart sounds.  Pulmonary:     Effort: Pulmonary effort is normal.     Breath sounds: Normal breath sounds.  Abdominal:     General: There is no distension.     Palpations: Abdomen is soft.     Tenderness: There is no abdominal tenderness.  Musculoskeletal: Normal range of motion.     Comments: Normal pulses left foot.  Mild swelling of the left leg as compared to the right.  Barely appreciable.  Skin:    General: Skin is warm and dry.  Neurological:     Mental Status: She is alert and oriented to person, place, and time.  Psychiatric:        Judgment: Judgment normal.      ED Treatments / Results  Labs (all labs ordered are listed, but only abnormal results are displayed) Labs Reviewed  BASIC METABOLIC PANEL - Abnormal; Notable for the following components:      Result Value   Glucose, Bld 110 (*)    Calcium 11.6 (*)    All other components within normal limits  CBC - Abnormal; Notable for the following components:   RBC 5.18 (*)    Hemoglobin 15.7 (*)    HCT 46.8 (*)     All other components within normal limits  TROPONIN I (HIGH SENSITIVITY)  I-STAT BETA HCG BLOOD, ED ([facility], [facility], [facility] ONLY)    EKG EKG Interpretation  Date/Time:  Wednesday August 16 2018 09:43:33 EDT Ventricular Rate:  81 PR Interval:    QRS Duration: 99 QT Interval:  354 QTC  Calculation: 411 R Axis:   108 Text Interpretation:  Sinus rhythm Right axis deviation Low voltage, precordial leads No significant change was found Confirmed by Jola Schmidt [phone]) on 08/16/2018 10:02:37 AM Also confirmed by Jola Schmidt [phone]), editor Hattie Perch ([id number])  on 08/16/2018 4:13:50 PM   Radiology No results found.  Procedures Procedures (including critical care time)  Medications Ordered in ED Medications - No data to display   Initial Impression / Assessment and Plan / ED Course  I have reviewed the triage vital signs and the nursing notes.  Pertinent labs & imaging results that were available during my care of the patient were reviewed by me and considered in my medical decision making (see chart for details).       Patient is overall well-appearing.  Unilateral leg swelling without clear etiology.  Normal pulses in the left foot.  Duplex scanning is negative for DVT.  No active chest pain at this time.  Work-up otherwise without significant abnormality.  Recommend elevation and compression stockings.    Final Clinical Impressions(s) / ED Diagnoses   Final diagnoses:  Acute leg pain, left  Precordial chest pain    ED Discharge Orders         Ordered    HYDROcodone-acetaminophen (NORCO/VICODIN) 5-325 MG tablet  Every 4 hours PRN     08/16/18 1251           Jola Schmidt, MD 08/19/18 1543

## 2018-08-21 ENCOUNTER — Encounter: Payer: Self-pay | Admitting: Family Medicine

## 2018-08-21 DIAGNOSIS — M79605 Pain in left leg: Secondary | ICD-10-CM

## 2018-09-06 ENCOUNTER — Ambulatory Visit: Payer: Self-pay | Admitting: Surgery

## 2018-09-21 ENCOUNTER — Ambulatory Visit
Admission: RE | Admit: 2018-09-21 | Discharge: 2018-09-21 | Disposition: A | Discharge status: Home / Self Care | Payer: 59 | Source: Ambulatory Visit | Attending: Family Medicine | Admitting: Family Medicine

## 2018-09-21 DIAGNOSIS — M79605 Pain in left leg: Secondary | ICD-10-CM

## 2018-09-22 ENCOUNTER — Telehealth: Payer: Self-pay | Admitting: Family Medicine

## 2018-09-22 ENCOUNTER — Other Ambulatory Visit: Payer: 59

## 2018-09-22 DIAGNOSIS — M79605 Pain in left leg: Secondary | ICD-10-CM

## 2018-09-22 NOTE — Telephone Encounter (Signed)
Lumbar MRI scan shows the following:  There is an L4-5 left-sided disc protrusion causing impingement of the left L5 nerve root.  This would explain the left leg pain.  Treatment options include referral for epidural steroid injection, or possibly surgical consult.

## 2018-09-22 NOTE — Addendum Note (Signed)
Addended by: Hortencia Pilar on: 09/22/2018 11:58 AM   Modules accepted: Orders

## 2018-09-25 ENCOUNTER — Other Ambulatory Visit: Payer: Self-pay | Admitting: Internal Medicine

## 2018-09-25 DIAGNOSIS — E041 Nontoxic single thyroid nodule: Secondary | ICD-10-CM

## 2018-10-09 ENCOUNTER — Encounter: Payer: 59 | Admitting: Physical Medicine and Rehabilitation

## 2018-10-12 NOTE — Patient Instructions (Addendum)
DUE TO COVID-19 ONLY ONE VISITOR IS ALLOWED TO COME WITH YOU AND STAY IN THE WAITING ROOM ONLY DURING PRE OP AND PROCEDURE DAY OF SURGERY. THE 1 VISITOR MAY VISIT WITH YOU AFTER SURGERY IN YOUR PRIVATE ROOM DURING VISITING HOURS ONLY!  YOU NEED TO HAVE A COVID 19 TEST ON 10-16-2018 @1005 . THIS TEST MUST BE DONE BEFORE SURGERY, COME  [address], [place] , [address].  ([place]) ONCE YOUR COVID TEST IS COMPLETED, PLEASE BEGIN THE QUARANTINE INSTRUCTIONS AS OUTLINED IN YOUR HANDOUT.                Kandis Hartmann    Your procedure is scheduled on: 10-19-2018   Report to [facility] Main  Entrance   Report to  [facility] at 530  AM     Call this number if you have problems the morning of surgery [phone]    Remember: Do not eat food or drink liquids :After Midnight. BRUSH YOUR TEETH MORNING OF SURGERY AND RINSE YOUR MOUTH OUT, NO CHEWING GUM CANDY OR MINTS.     Take these medicines the morning of surgery with A SIP OF WATER: , BUPROPION (WELLBUTRIN)                                 You may not have any metal on your body including hair pins and              piercings  Do not wear jewelry, make-up, lotions, powders or perfumes, deodorant             Do not wear nail polish.  Do not shave  48 hours prior to surgery.                Do not bring valuables to the hospital. [place].  Contacts, dentures or bridgework may not be worn into surgery.  Leave suitcase in the car. After surgery it may be brought to your room.    _____________________________________________________________________             [facility] - Preparing for Surgery Before surgery, you can play an important role.  Because skin is not sterile, your skin needs to be as free of germs as possible.  You can reduce the number of germs on your skin by washing with CHG (chlorahexidine gluconate) soap before surgery.  CHG is an  antiseptic cleaner which kills germs and bonds with the skin to continue killing germs even after washing. Please DO NOT use if you have an allergy to CHG or antibacterial soaps.  If your skin becomes reddened/irritated stop using the CHG and inform your nurse when you arrive at Short Stay. Do not shave (including legs and underarms) for at least 48 hours prior to the first CHG shower.  You may shave your face/neck. Please follow these instructions carefully:  1.  Shower with CHG Soap the night before surgery and the  morning of Surgery.  2.  If you choose to wash your hair, wash your hair first as usual with your  normal  shampoo.  3.  After you shampoo, rinse your hair and body thoroughly to remove the  shampoo.  4.  Use CHG as you would any other liquid soap.  You can apply chg directly  to the skin and wash                       Gently with a scrungie or clean washcloth.  5.  Apply the CHG Soap to your body ONLY FROM THE NECK DOWN.   Do not use on face/ open                           Wound or open sores. Avoid contact with eyes, ears mouth and genitals (private parts).                       Wash face,  Genitals (private parts) with your normal soap.             6.  Wash thoroughly, paying special attention to the area where your surgery  will be performed.  7.  Thoroughly rinse your body with warm water from the neck down.  8.  DO NOT shower/wash with your normal soap after using and rinsing off  the CHG Soap.                9.  Pat yourself dry with a clean towel.            10.  Wear clean pajamas.            11.  Place clean sheets on your bed the night of your first shower and do not  sleep with pets. Day of Surgery : Do not apply any lotions/deodorants the morning of surgery.  Please wear clean clothes to the hospital/surgery center.  FAILURE TO FOLLOW THESE INSTRUCTIONS MAY RESULT IN THE CANCELLATION OF YOUR SURGERY PATIENT  SIGNATURE_________________________________  NURSE SIGNATURE__________________________________  ________________________________________________________________________

## 2018-10-15 ENCOUNTER — Encounter (HOSPITAL_COMMUNITY): Payer: Self-pay | Admitting: Surgery

## 2018-10-15 NOTE — H&P (Signed)
General Surgery [facility]  Eugenie L Fuhr DOB: 09-Jan-1966 Married / Language: English / Race: White Female   History of Present Illness  The patient is a 53 year old female who presents with primary hyperparathyroidism.  CHIEF COMPLAINT: post op parathyroidectomy  Patient returns for postoperative visit having undergone minimally invasive parathyroidectomy on August 10, 2018. She underwent right inferior parathyroidectomy. We removed a 328 mg parathyroid gland based on diagnostic studies including a nuclear medicine parathyroid scan, ultrasound examination, and 4D CT scan of the neck. Unfortunately, her postoperative laboratory studies have not shown any improvement or normalization. Postop calcium remains elevated at 11.6. Intact PTH level remains elevated at 131. This is indicative that a parathyroid adenoma remains in the neck and was not identified by the diagnostic studies. Postoperative course was complicated by left lower extremity swelling and pain. She has undergone further evaluation including a Doppler examination which ruled out DVT. She is now being seen by orthopedic surgery and has been going to physical therapy. Patient returns today for wound check and to discuss further management of primary hyperparathyroidism.   Allergies  Morphine Sulfate (PF) *ANALGESICS - OPIOID*   Medication History tiZANidine HCl (2MG  Tablet, Oral) Active. buPROPion HCl ER (XL) (300MG  Tablet ER [id number], Oral) Active. Carafate (1GM/10ML Suspension, Oral) Active. Omeprazole (40MG  Capsule DR, Oral) Active. Desvenlafaxine ER (50MG  Tablet ER 24HR, Oral) Active. Iron (325 (65 Fe)MG Tablet, Oral) Active. Multivitamin Adult (Oral) Active. Vitamin B12 (1000MCG Tablet ER, Oral) Active. Vitamin D3 (1000UNIT Capsule, Oral) Active. Medications Reconciled  Vitals  Weight: 196 lb Height: 69in Body Surface Area: 2.05 m Body Mass Index: 28.94 kg/m   Temp.: 97.82F (Temporal)  Pulse: 84 (Regular)  BP: 138/90(Sitting, Left Arm, Standard)  Physical Exam  Limited examination  Right anterior cervical incision is healing nicely. Dermabond remains in place. Mild soft tissue swelling. No sign of seroma or hematoma or infection. Voice quality is normal.    Assessment & Plan   PRIMARY HYPERPARATHYROIDISM (E21.0) STATUS POST PARATHYROIDECTOMY [id number])  Patient returns for postoperative visit having undergone minimally invasive parathyroidectomy on August 10, 2018. Pathology revealed a mildly enlarged parathyroid gland weighing 328 mg. It did not appear hypercellular. Postoperative laboratory studies have remained elevated with a calcium level of 11.6 and an intact PTH of 131.  At this point, I would recommend returning to the operating room for neck exploration and parathyroidectomy. We would explore all 4 locations in the neck for parathyroid adenoma. We would also evaluate the right superior thyroid nodule for possible intrathyroidal parathyroid adenoma. The chances of success with this operation are very high. He would require an overnight hospital stay. We discussed the location and size of the surgical incision. Patient understands and agrees to proceed.  We also discussed additional studies in the event that neck exploration does not reveal a parathyroid adenoma. This would include studies such as MRI scan of the neck and chest and also a selective venous sampling evaluation. We will hold off on these studies for now.  The risks and benefits of the procedure have been discussed at length with the patient. The patient understands the proposed procedure, potential alternative treatments, and the course of recovery to be expected. All of the patient's questions have been answered at this time. The patient wishes to proceed with surgery.   [name], [place] Office: [phone]

## 2018-10-16 ENCOUNTER — Encounter (HOSPITAL_COMMUNITY)
Admission: RE | Admit: 2018-10-16 | Discharge: 2018-10-16 | Disposition: A | Discharge status: Home / Self Care | Payer: 59 | Source: Ambulatory Visit | Attending: Surgery | Admitting: Surgery

## 2018-10-16 ENCOUNTER — Encounter (HOSPITAL_COMMUNITY): Payer: Self-pay

## 2018-10-16 ENCOUNTER — Other Ambulatory Visit: Payer: Self-pay

## 2018-10-16 ENCOUNTER — Other Ambulatory Visit (HOSPITAL_COMMUNITY)
Admission: RE | Admit: 2018-10-16 | Discharge: 2018-10-16 | Disposition: A | Discharge status: Home / Self Care | Payer: 59 | Source: Ambulatory Visit | Attending: Surgery | Admitting: Surgery

## 2018-10-16 DIAGNOSIS — Z01812 Encounter for preprocedural laboratory examination: Secondary | ICD-10-CM | POA: Insufficient documentation

## 2018-10-16 DIAGNOSIS — E210 Primary hyperparathyroidism: Secondary | ICD-10-CM | POA: Insufficient documentation

## 2018-10-16 DIAGNOSIS — Z20828 Contact with and (suspected) exposure to other viral communicable diseases: Secondary | ICD-10-CM | POA: Diagnosis not present

## 2018-10-16 HISTORY — DX: Elevated blood-pressure reading, without diagnosis of hypertension: R03.0

## 2018-10-16 LAB — CBC
HCT: 46.4 % — ABNORMAL HIGH (ref 36.0–46.0)
Hemoglobin: 15 g/dL (ref 12.0–15.0)
MCH: 29.8 pg (ref 26.0–34.0)
MCHC: 32.3 g/dL (ref 30.0–36.0)
MCV: 92.2 fL (ref 80.0–100.0)
Platelets: 240 10*3/uL (ref 150–400)
RBC: 5.03 MIL/uL (ref 3.87–5.11)
RDW: 12.3 % (ref 11.5–15.5)
WBC: 4.7 10*3/uL (ref 4.0–10.5)
nRBC: 0 % (ref 0.0–0.2)

## 2018-10-16 LAB — BASIC METABOLIC PANEL
Anion gap: 8 (ref 5–15)
BUN: 14 mg/dL (ref 6–20)
CO2: 27 mmol/L (ref 22–32)
Calcium: 11.6 mg/dL — ABNORMAL HIGH (ref 8.9–10.3)
Chloride: 104 mmol/L (ref 98–111)
Creatinine, Ser: 0.67 mg/dL (ref 0.44–1.00)
GFR calc Af Amer: >60 mL/min (ref >60)
GFR calc non Af Amer: >60 mL/min (ref >60)
Glucose, Bld: 99 mg/dL (ref 70–99)
Potassium: 5.2 mmol/L — ABNORMAL HIGH (ref 3.5–5.1)
Sodium: 139 mmol/L (ref 135–145)

## 2018-10-16 LAB — SARS CORONAVIRUS 2 (TAT 6-24 HRS): SARS Coronavirus 2: NEGATIVE

## 2018-10-16 NOTE — Progress Notes (Addendum)
PCP - dr Norva Pavlov Cardiologist - none  Chest x-ray - 08-16-18 epic EKG - 08-16-18 epic Stress Test - none ECHO - none Cardiac Cath - none  Sleep Study - none CPAP - none  Fasting Blood Sugar - n/a Checks Blood Sugar _____ times a day  Blood Thinner Instructions:none Aspirin Instructions: Last Dose:   has brain cyst sees de yan yijun nerulogy q year lov neurology 03-10-2018 epic Has white coat syndrome bp elevated at pre op Anesthesia review:   Patient denies shortness of breath, fever, cough and chest pain at PAT appointment   Patient verbalized understanding of instructions that were given to them at the PAT appointment. Patient was also instructed that they will need to review over the PAT instructions again at home before surgery.

## 2018-10-18 NOTE — Anesthesia Preprocedure Evaluation (Addendum)
Anesthesia Evaluation  Patient identified by MRN, date of birth, ID band Patient awake    Reviewed: Allergy & Precautions, NPO status , Patient's Chart, lab work & pertinent test results  History of Anesthesia Complications Negative for: history of anesthetic complications  Airway Mallampati: II  TM Distance: >3 FB Neck ROM: Full    Dental  (+) Upper Dentures, Partial Lower   Pulmonary neg pulmonary ROS,    Pulmonary exam normal        Cardiovascular hypertension, Normal cardiovascular exam     Neuro/Psych PSYCHIATRIC DISORDERS Anxiety negative neurological ROS     GI/Hepatic Neg liver ROS, PUD,   Endo/Other  hyperparathyroidism  Renal/GU negative Renal ROS  negative genitourinary   Musculoskeletal negative musculoskeletal ROS (+)   Abdominal   Peds  Hematology negative hematology ROS (+)   Anesthesia Other Findings   Reproductive/Obstetrics                            Anesthesia Physical Anesthesia Plan  ASA: II  Anesthesia Plan: General   Post-op Pain Management:    Induction: Intravenous  PONV Risk Score and Plan: Ondansetron, Dexamethasone, Treatment may vary due to age or medical condition and Midazolam  Airway Management Planned: Oral ETT  Additional Equipment: None  Intra-op Plan:   Post-operative Plan: Extubation in OR  Informed Consent: I have reviewed the patients History and Physical, chart, labs and discussed the procedure including the risks, benefits and alternatives for the proposed anesthesia with the patient or authorized representative who has indicated his/her understanding and acceptance.     Dental advisory given  Plan Discussed with:   Anesthesia Plan Comments:        Anesthesia Quick Evaluation

## 2018-10-19 ENCOUNTER — Other Ambulatory Visit: Payer: Self-pay

## 2018-10-19 ENCOUNTER — Ambulatory Visit (HOSPITAL_COMMUNITY): Payer: 59 | Admitting: Anesthesiology

## 2018-10-19 ENCOUNTER — Observation Stay (HOSPITAL_COMMUNITY)
Admission: RE | Admit: 2018-10-19 | Discharge: 2018-10-20 | Disposition: A | Discharge status: Home / Self Care | Payer: 59 | Attending: Surgery | Admitting: Surgery

## 2018-10-19 ENCOUNTER — Encounter (HOSPITAL_COMMUNITY): Payer: Self-pay | Admitting: *Deleted

## 2018-10-19 ENCOUNTER — Encounter (HOSPITAL_COMMUNITY)
Admission: RE | Disposition: A | Discharge status: Home / Self Care | Payer: Self-pay | Source: Home / Self Care | Attending: Surgery

## 2018-10-19 ENCOUNTER — Ambulatory Visit (HOSPITAL_COMMUNITY): Payer: 59 | Admitting: Physician Assistant

## 2018-10-19 DIAGNOSIS — E210 Primary hyperparathyroidism: Secondary | ICD-10-CM | POA: Diagnosis not present

## 2018-10-19 DIAGNOSIS — Z79899 Other long term (current) drug therapy: Secondary | ICD-10-CM | POA: Insufficient documentation

## 2018-10-19 DIAGNOSIS — E063 Autoimmune thyroiditis: Secondary | ICD-10-CM | POA: Insufficient documentation

## 2018-10-19 DIAGNOSIS — I10 Essential (primary) hypertension: Secondary | ICD-10-CM | POA: Insufficient documentation

## 2018-10-19 DIAGNOSIS — E042 Nontoxic multinodular goiter: Secondary | ICD-10-CM | POA: Insufficient documentation

## 2018-10-19 DIAGNOSIS — F419 Anxiety disorder, unspecified: Secondary | ICD-10-CM | POA: Insufficient documentation

## 2018-10-19 DIAGNOSIS — D351 Benign neoplasm of parathyroid gland: Secondary | ICD-10-CM | POA: Diagnosis not present

## 2018-10-19 HISTORY — PX: PARATHYROIDECTOMY: SHX19

## 2018-10-19 SURGERY — PARATHYROIDECTOMY
Anesthesia: General | Site: Neck

## 2018-10-19 MED ORDER — LACTATED RINGERS IV SOLN
INTRAVENOUS | Status: DC
  Administered 2018-10-19 (×2): via INTRAVENOUS

## 2018-10-19 MED ORDER — MIDAZOLAM HCL 5 MG/5ML IJ SOLN
INTRAMUSCULAR | Status: DC | PRN
  Administered 2018-10-19: 2 mg via INTRAVENOUS

## 2018-10-19 MED ORDER — EPHEDRINE SULFATE-NACL 50-0.9 MG/10ML-% IV SOSY
PREFILLED_SYRINGE | INTRAVENOUS | Status: DC | PRN
  Administered 2018-10-19: 5 mg via INTRAVENOUS

## 2018-10-19 MED ORDER — OXYCODONE HCL 5 MG PO TABS: 5.0000 mg | ORAL_TABLET | Freq: Once | ORAL | Status: DC | PRN

## 2018-10-19 MED ORDER — BUPROPION HCL ER (XL) 300 MG PO TB24
300.0000 mg | ORAL_TABLET | Freq: Every day | ORAL | Status: DC
  Administered 2018-10-20: 10:00:00 300 mg via ORAL
  Filled 2018-10-19: qty 1

## 2018-10-19 MED ORDER — TRAMADOL HCL 50 MG PO TABS
50.0000 mg | ORAL_TABLET | Freq: Four times a day (QID) | ORAL | Status: DC | PRN
  Administered 2018-10-20: 10:00:00 50 mg via ORAL
  Filled 2018-10-19: qty 1

## 2018-10-19 MED ORDER — 0.9 % SODIUM CHLORIDE (POUR BTL) OPTIME
TOPICAL | Status: DC | PRN
  Administered 2018-10-19: 08:00:00 1000 mL

## 2018-10-19 MED ORDER — KCL IN DEXTROSE-NACL 20-5-0.45 MEQ/L-%-% IV SOLN
INTRAVENOUS | Status: DC
  Administered 2018-10-19 – 2018-10-20 (×2): via INTRAVENOUS
  Filled 2018-10-19: qty 1000

## 2018-10-19 MED ORDER — HYDROMORPHONE HCL 1 MG/ML IJ SOLN
INTRAMUSCULAR | Status: AC
  Administered 2018-10-20: 1 mg via INTRAVENOUS
  Filled 2018-10-19: qty 1

## 2018-10-19 MED ORDER — FENTANYL CITRATE (PF) 100 MCG/2ML IJ SOLN
INTRAMUSCULAR | Status: AC
  Filled 2018-10-19: qty 2

## 2018-10-19 MED ORDER — DEXAMETHASONE SODIUM PHOSPHATE 10 MG/ML IJ SOLN
INTRAMUSCULAR | Status: AC
  Filled 2018-10-19: qty 1

## 2018-10-19 MED ORDER — DEXAMETHASONE SODIUM PHOSPHATE 10 MG/ML IJ SOLN
INTRAMUSCULAR | Status: DC | PRN
  Administered 2018-10-19: 10 mg via INTRAVENOUS

## 2018-10-19 MED ORDER — ACETAMINOPHEN 650 MG RE SUPP
650.0000 mg | Freq: Four times a day (QID) | RECTAL | Status: DC | PRN
  Filled 2018-10-19: qty 1

## 2018-10-19 MED ORDER — LIDOCAINE 2% (20 MG/ML) 5 ML SYRINGE
INTRAMUSCULAR | Status: AC
  Filled 2018-10-19: qty 5

## 2018-10-19 MED ORDER — PROPOFOL 10 MG/ML IV BOLUS
INTRAVENOUS | Status: AC
  Filled 2018-10-19: qty 20

## 2018-10-19 MED ORDER — FENTANYL CITRATE (PF) 100 MCG/2ML IJ SOLN
25.0000 ug | INTRAMUSCULAR | Status: DC | PRN
  Administered 2018-10-19: 25 ug via INTRAVENOUS
  Administered 2018-10-19: 50 ug via INTRAVENOUS
  Administered 2018-10-19: 12:00:00 25 ug via INTRAVENOUS

## 2018-10-19 MED ORDER — ONDANSETRON HCL 4 MG/2ML IJ SOLN
INTRAMUSCULAR | Status: DC | PRN
  Administered 2018-10-19 (×2): 4 mg via INTRAVENOUS

## 2018-10-19 MED ORDER — CHLORHEXIDINE GLUCONATE CLOTH 2 % EX PADS: 6.0000 | MEDICATED_PAD | Freq: Once | CUTANEOUS | Status: DC

## 2018-10-19 MED ORDER — HYDROMORPHONE HCL 1 MG/ML IJ SOLN
1.0000 mg | INTRAMUSCULAR | Status: DC | PRN
  Administered 2018-10-19 – 2018-10-20 (×4): 1 mg via INTRAVENOUS
  Filled 2018-10-19 (×2): qty 1

## 2018-10-19 MED ORDER — ONDANSETRON 4 MG PO TBDP: 4.0000 mg | ORAL_TABLET | Freq: Four times a day (QID) | ORAL | Status: DC | PRN

## 2018-10-19 MED ORDER — PHENYLEPHRINE 40 MCG/ML (10ML) SYRINGE FOR IV PUSH (FOR BLOOD PRESSURE SUPPORT)
PREFILLED_SYRINGE | INTRAVENOUS | Status: AC
  Filled 2018-10-19: qty 10

## 2018-10-19 MED ORDER — ACETAMINOPHEN 325 MG PO TABS
650.0000 mg | ORAL_TABLET | Freq: Four times a day (QID) | ORAL | Status: DC | PRN
  Administered 2018-10-19: 650 mg via ORAL
  Filled 2018-10-19: qty 2

## 2018-10-19 MED ORDER — PROPOFOL 10 MG/ML IV BOLUS
INTRAVENOUS | Status: DC | PRN
  Administered 2018-10-19: 150 mg via INTRAVENOUS

## 2018-10-19 MED ORDER — BUPIVACAINE HCL 0.25 % IJ SOLN
INTRAMUSCULAR | Status: DC | PRN
  Administered 2018-10-19: 10 mL

## 2018-10-19 MED ORDER — ROCURONIUM BROMIDE 10 MG/ML (PF) SYRINGE
PREFILLED_SYRINGE | INTRAVENOUS | Status: AC
  Filled 2018-10-19: qty 10

## 2018-10-19 MED ORDER — SUGAMMADEX SODIUM 200 MG/2ML IV SOLN
INTRAVENOUS | Status: DC | PRN
  Administered 2018-10-19: 200 mg via INTRAVENOUS

## 2018-10-19 MED ORDER — ONDANSETRON HCL 4 MG/2ML IJ SOLN
INTRAMUSCULAR | Status: AC
  Filled 2018-10-19: qty 2

## 2018-10-19 MED ORDER — ONDANSETRON HCL 4 MG/2ML IJ SOLN: 4.0000 mg | Freq: Once | INTRAMUSCULAR | Status: DC | PRN

## 2018-10-19 MED ORDER — LIDOCAINE 2% (20 MG/ML) 5 ML SYRINGE
INTRAMUSCULAR | Status: DC | PRN
  Administered 2018-10-19: 100 mg via INTRAVENOUS
  Administered 2018-10-19: 80 mg via INTRAVENOUS

## 2018-10-19 MED ORDER — OXYCODONE HCL 5 MG/5ML PO SOLN: 5.0000 mg | Freq: Once | ORAL | Status: DC | PRN

## 2018-10-19 MED ORDER — EPHEDRINE 5 MG/ML INJ
INTRAVENOUS | Status: AC
  Filled 2018-10-19: qty 10

## 2018-10-19 MED ORDER — HYDROCODONE-ACETAMINOPHEN 5-325 MG PO TABS
1.0000 | ORAL_TABLET | ORAL | Status: DC | PRN
  Administered 2018-10-19: 20:00:00 1 via ORAL
  Administered 2018-10-19: 2 via ORAL
  Administered 2018-10-19: 1 via ORAL
  Administered 2018-10-20: 15:00:00 2 via ORAL
  Filled 2018-10-19: qty 2
  Filled 2018-10-19 (×2): qty 1
  Filled 2018-10-19 (×2): qty 2

## 2018-10-19 MED ORDER — ROCURONIUM BROMIDE 10 MG/ML (PF) SYRINGE
PREFILLED_SYRINGE | INTRAVENOUS | Status: DC | PRN
  Administered 2018-10-19: 60 mg via INTRAVENOUS
  Administered 2018-10-19: 20 mg via INTRAVENOUS
  Administered 2018-10-19 (×2): 10 mg via INTRAVENOUS

## 2018-10-19 MED ORDER — FENTANYL CITRATE (PF) 100 MCG/2ML IJ SOLN
INTRAMUSCULAR | Status: DC | PRN
  Administered 2018-10-19 (×4): 50 ug via INTRAVENOUS

## 2018-10-19 MED ORDER — MIDAZOLAM HCL 2 MG/2ML IJ SOLN
INTRAMUSCULAR | Status: AC
  Filled 2018-10-19: qty 2

## 2018-10-19 MED ORDER — PHENYLEPHRINE 40 MCG/ML (10ML) SYRINGE FOR IV PUSH (FOR BLOOD PRESSURE SUPPORT)
PREFILLED_SYRINGE | INTRAVENOUS | Status: DC | PRN
  Administered 2018-10-19 (×5): 80 ug via INTRAVENOUS
  Administered 2018-10-19: 40 ug via INTRAVENOUS
  Administered 2018-10-19 (×3): 80 ug via INTRAVENOUS

## 2018-10-19 MED ORDER — CEFAZOLIN SODIUM-DEXTROSE 2-4 GM/100ML-% IV SOLN
2.0000 g | INTRAVENOUS | Status: AC
  Administered 2018-10-19: 2 g via INTRAVENOUS
  Filled 2018-10-19: qty 100

## 2018-10-19 MED ORDER — ONDANSETRON HCL 4 MG/2ML IJ SOLN
4.0000 mg | Freq: Four times a day (QID) | INTRAMUSCULAR | Status: DC | PRN
  Administered 2018-10-20: 4 mg via INTRAVENOUS
  Filled 2018-10-19: qty 2

## 2018-10-19 SURGICAL SUPPLY — 31 items
ATTRACTOMAT 16X20 MAGNETIC DRP (DRAPES) ×2 IMPLANT
BLADE SURG 15 STRL LF DISP TIS (BLADE) ×1 IMPLANT
BLADE SURG 15 STRL SS (BLADE) ×1
BLADE SURG SZ10 CARB STEEL (BLADE) ×2 IMPLANT
CHLORAPREP W/TINT 26 (MISCELLANEOUS) ×2 IMPLANT
CLIP VESOCCLUDE MED 6/CT (CLIP) ×10 IMPLANT
CLIP VESOCCLUDE SM WIDE 6/CT (CLIP) ×14 IMPLANT
COVER SURGICAL LIGHT HANDLE (MISCELLANEOUS) ×2 IMPLANT
COVER WAND RF STERILE (DRAPES) ×2 IMPLANT
DERMABOND ADVANCED (GAUZE/BANDAGES/DRESSINGS) ×1
DERMABOND ADVANCED .7 DNX12 (GAUZE/BANDAGES/DRESSINGS) ×1 IMPLANT
DRAPE LAPAROTOMY T 98X78 PEDS (DRAPES) ×2 IMPLANT
ELECT PENCIL ROCKER SW 15FT (MISCELLANEOUS) ×2 IMPLANT
ELECT REM PT RETURN 15FT ADLT (MISCELLANEOUS) ×2 IMPLANT
GAUZE 4X4 16PLY RFD (DISPOSABLE) ×8 IMPLANT
GLOVE SURG ORTHO 8.0 STRL STRW (GLOVE) ×2 IMPLANT
GOWN STRL REUS W/TWL XL LVL3 (GOWN DISPOSABLE) ×6 IMPLANT
HEMOSTAT SURGICEL 2X4 FIBR (HEMOSTASIS) ×2 IMPLANT
ILLUMINATOR WAVEGUIDE N/F (MISCELLANEOUS) ×2 IMPLANT
KIT BASIN OR (CUSTOM PROCEDURE TRAY) ×2 IMPLANT
KIT TURNOVER KIT A (KITS) IMPLANT
NEEDLE HYPO 25X1 1.5 SAFETY (NEEDLE) ×2 IMPLANT
PACK BASIC VI WITH GOWN DISP (CUSTOM PROCEDURE TRAY) ×2 IMPLANT
SHEARS HARMONIC 9CM CVD (BLADE) ×2 IMPLANT
SUT MNCRL AB 4-0 PS2 18 (SUTURE) ×2 IMPLANT
SUT VIC AB 3-0 SH 18 (SUTURE) ×2 IMPLANT
SYR BULB IRRIGATION 50ML (SYRINGE) ×2 IMPLANT
SYR CONTROL 10ML LL (SYRINGE) ×2 IMPLANT
TOWEL OR 17X26 10 PK STRL BLUE (TOWEL DISPOSABLE) ×2 IMPLANT
TOWEL OR NON WOVEN STRL DISP B (DISPOSABLE) ×2 IMPLANT
TUBING CONNECTING 10 (TUBING) ×2 IMPLANT

## 2018-10-19 NOTE — Anesthesia Postprocedure Evaluation (Signed)
Anesthesia Post Note  Patient: Cynthia Hoover  Procedure(s) Performed: NECK EXPLORATION, TOTAL THYROIDECTOMY, BIOPSY OF LEFT SUPERIOR PARATHYROID; RIGHT SUPERIOR PARATHYROIDECTOMY (N/A Neck)     Patient location during evaluation: PACU Anesthesia Type: General Level of consciousness: awake and alert Pain management: pain level controlled Vital Signs Assessment: post-procedure vital signs reviewed and stable Respiratory status: spontaneous breathing, nonlabored ventilation, respiratory function stable and patient connected to nasal cannula oxygen Cardiovascular status: blood pressure returned to baseline and stable Postop Assessment: no apparent nausea or vomiting Anesthetic complications: no    Last Vitals:  Vitals:   10/19/18 1130 10/19/18 1145  BP: (!) 152/86 (!) 146/91  Pulse: 81 81  Resp: 10 14  Temp:    SpO2: 99% 98%    Last Pain:  Vitals:   10/19/18 1150  TempSrc:   PainSc: 5                  Lidia Collum

## 2018-10-19 NOTE — Plan of Care (Signed)
Patient arrived to unit from PACU; ambulated from stretcher in hall to bed in room. No needs expressed at this time. Will continue to monitor.

## 2018-10-19 NOTE — Progress Notes (Signed)
General Surgery [facility]  Patient seen and examined.  No signs or symptoms of hypocalcemia.  Voice soft, slightly hoarse.  Discussed the operation performed and rationale for total thyroidectomy.  Discussed ectopic location of parathyroid adenoma.  Patient expresses understanding and appreciation.  Will check calcium level in AM as long as no symptoms overnight.  Likely home in AM.  Above discussed earlier today with patient's husband.  [name], [place] [facility] Office: [phone]

## 2018-10-19 NOTE — Transfer of Care (Signed)
Immediate Anesthesia Transfer of Care Note  Patient: Cynthia Hoover  Procedure(s) Performed: NECK EXPLORATION, TOTAL THYROIDECTOMY, BIOPSY OF LEFT SUPERIOR PARATHYROID; RIGHT SUPERIOR PARATHYROIDECTOMY (N/A Neck)  Patient Location: PACU  Anesthesia Type:General  Level of Consciousness: awake, alert  and oriented  Airway & Oxygen Therapy: Patient Spontanous Breathing and Patient connected to face mask oxygen  Post-op Assessment: Report given to RN and Post -op Vital signs reviewed and stable  Post vital signs: Reviewed and stable  Last Vitals:  Vitals Value Taken Time  BP 152/86 10/19/18 1130  Temp 37.1 C 10/19/18 1127  Pulse 80 10/19/18 1131  Resp 13 10/19/18 1131  SpO2 100 % 10/19/18 1131  Vitals shown include unvalidated device data.  Last Pain:  Vitals:   10/19/18 1127  TempSrc:   PainSc: 0-No pain         Complications: No apparent anesthesia complications

## 2018-10-19 NOTE — Op Note (Signed)
Operative Note  Pre-operative Diagnosis:  Primary hyperparathyroidism  Post-operative Diagnosis:  Same, multinodular thyroid goiter  Surgeon:  [name], MD  Assistant:  Carlena Hurl, PA-C   Procedure:  Neck exploration, total thyroidectomy, right superior parathyroidectomy (ectopic), biopsy left superior parathyroid  Anesthesia:  general  Estimated Blood Loss:  150 cc  Drains: none         Specimen: to path, multiple  Indications:  Patient returns for postoperative visit having undergone minimally invasive parathyroidectomy on August 10, 2018. She underwent right inferior parathyroidectomy. We removed a 328 mg parathyroid gland based on diagnostic studies including a nuclear medicine parathyroid scan, ultrasound examination, and 4D CT scan of the neck. Unfortunately, her postoperative laboratory studies have not shown any improvement or normalization. Postop calcium remains elevated at 11.6. Intact PTH level remains elevated at 131. This is indicative that a parathyroid adenoma remains in the neck and was not identified by the diagnostic studies.  Patient now comes to surgery for neck exploration.  Procedure Details:  The patient was seen in the pre-op holding area. The risks, benefits, complications, treatment options, and expected outcomes were previously discussed with the patient. The patient agreed with the proposed plan and has signed the informed consent form.  The patient was brought to the operating room by the surgical team, identified as Larkin Sultana and the procedure verified. A "time out" was completed and the above information confirmed.  Following administration of general anesthesia, the patient is positioned and then prepped and draped in the usual aseptic fashion.  After ascertaining that an adequate level of anesthesia been achieved, the patient's previous anterior cervical incision is reopened with a #15 blade and extended across the midline in a balanced  fashion.  Dissection was carried through subcutaneous tissues and platysma.  Hemostasis is achieved with the electrocautery.  Skin flaps are elevated cephalad and caudad from the thyroid notch to the sternal notch.  A [place] self-retaining retractor was placed for exposure.  Strap muscles are incised in the midline.  Dissection is begun on the left side.  Strap muscles are reflected laterally exposing an enlarged multinodular left thyroid lobe.  Thyroid tissue was quite firm.  Gland was gently mobilized.  Venous tributaries were divided between ligaclips.  Exploration inferiorly and superiorly failed to reveal evidence of an enlarged parathyroid gland.  Normal parathyroid tissue was not identified either.  However exploration was limited due to the size and firmness of the thyroid gland which limited visibility of the posterior thyroid bed.  Next we turned our attention to the right side.  Strap muscles were elevated off of the right lobe of the thyroid with some difficulty due to the previous surgical procedure.  The right lobe was mobilized.  There was some bleeding in the field which made visualization difficult.  Again the right thyroid lobe was large, firm, and multinodular.  Again this made visualization of the posterior thyroid bed difficult.  Exploration failed to reveal any sign of enlarged parathyroid gland.  At this point a decision was made to proceed with total thyroidectomy.  This was both for management of the enlarged multinodular thyroid gland and to facilitate parathyroid exploration.  Also, since parathyroid tissue had not been identified, there was the possibility of an intrathyroidal parathyroid adenoma.  Therefore we decided to proceed with total thyroidectomy.  Dissection was begun on the left side.  Left thyroid lobe had been partially mobilized.  Superior pole was mobilized and vasculature was divided between small and medium ligaclips  with the harmonic scalpel.  Gland was rolled  anteriorly.  Branches of the inferior thyroid artery were divided between small ligaclips.  Recurrent laryngeal nerve was identified and preserved.  Inferior venous tributaries were divided between ligaclips.  Gland was rolled anteriorly until the ligament of Gwenlyn Found was identified and released and the gland was mobilized onto the anterior trachea.  There was a moderate sized pyramidal lobe which was dissected off of the thyroid cartilage and resected with the isthmus.  Next we turned to the right side.  Again strap muscles were reflected laterally.  Superior pole was dissected out and superior pole vessels divided between small and medium ligaclips with the harmonic scalpel.  Gland was rolled anteriorly.  Inferior venous tributaries were divided between ligaclips.  Branches of the inferior thyroid artery were divided between small ligaclips.  Recurrent laryngeal nerve was identified and preserved along its course.  Ligament of Gwenlyn Found was released and the gland was mobilized onto the anterior trachea from which it was completely excised with the electrocautery.  Thyroid gland was sectioned with a #10 blade on the back table.  There was no evidence of intrathyroidal parathyroid tissue or enlarged parathyroid adenoma.  Suture was used to mark the left side of the thyroid gland.  The gland was then submitted in its entirety to pathology.  Dr. Claudette Laws also explored the gland and found no evidence of enlarged parathyroid gland or parathyroid adenoma.  Next we continued our exploration of the neck.  On the left side the left superior parathyroid was identified.  It appeared grossly normal.  A biopsy was taken and submitted to pathology.  Dr. Saralyn Pilar confirmed a normal left superior parathyroid gland.  Exploration on the left inferior position failed to reveal any evidence of enlarged parathyroid tissue.  2 nodules were excised and submitted for frozen section but these proved to be lymph nodes.  Exploration on  the right was confined to the superior location as the inferior parathyroid had previously been removed.  Exploration superiorly was continued until we explored back to the precervical fascia.  At this time the retroesophageal space was opened and explored.  We identified a enlarged parathyroid gland extending into the posterior mediastinum and the retroesophageal space.  Gland was gently mobilized away from the posterior aspect of the esophagus and delivered up and into the neck.  The gland was shaped in a teardrop fashion and the vascular pedicle was then traced superiorly until we were above the level of glandular tissue.  Vasculature was then divided between small ligaclips and the entire gland was excised.  Frozen section biopsy confirmed parathyroid tissue consistent with parathyroid adenoma.  Next the neck is irrigated with warm saline.  Good hemostasis is achieved throughout the operative field.  Fibrillar was placed throughout the operative field.  Strap muscles were reapproximated in the midline of interrupted 3-0 Vicryl sutures.  Platysma was closed with interrupted 3-0 Vicryl sutures.  Skin edges were anesthetized with local anesthetic.  Wound was closed with a running 4-0 Monocryl subcuticular suture.  Wound was washed and dried and Dermabond was applied as dressing.  Patient was awakened from anesthesia and brought to the recovery room in stable condition.  The patient tolerated the procedure well.   [name], MD [facility] Office: [phone]

## 2018-10-19 NOTE — Anesthesia Procedure Notes (Signed)
Procedure Name: Intubation Date/Time: 10/19/2018 7:23 AM Performed by: [place] Keymarion Bearman C, CRNA Pre-anesthesia Checklist: Patient identified, Emergency Drugs available, Suction available and Patient being monitored Patient Re-evaluated:Patient Re-evaluated prior to induction Oxygen Delivery Method: Circle system utilized Preoxygenation: Pre-oxygenation with 100% oxygen Induction Type: IV induction Ventilation: Mask ventilation without difficulty Laryngoscope Size: Mac and 3 Grade View: Grade II Tube type: Oral Tube size: 7.0 mm Number of attempts: 1 Airway Equipment and Method: Stylet and Oral airway Placement Confirmation: ETT inserted through vocal cords under direct vision,  positive ETCO2 and breath sounds checked- equal and bilateral Secured at: 21 cm Tube secured with: Tape Dental Injury: Teeth and Oropharynx as per pre-operative assessment

## 2018-10-19 NOTE — Interval H&P Note (Signed)
History and Physical Interval Note:  10/19/2018 7:00 AM  Cynthia Hoover  has presented today for surgery, with the diagnosis of PRIMARY HYPERPARATHYROIDISM.  The various methods of treatment have been discussed with the patient and family. After consideration of risks, benefits and other options for treatment, the patient has consented to    Procedure(s): NECK EXPLORATION WITH PARATHYROIDECTOMY (N/A) as a surgical intervention.    The patient's history has been reviewed, patient examined, no change in status, stable for surgery.  I have reviewed the patient's chart and labs.  Questions were answered to the patient's satisfaction.    [name], [place] [facility] Office: [place]

## 2018-10-20 ENCOUNTER — Encounter (HOSPITAL_COMMUNITY): Payer: Self-pay | Admitting: Surgery

## 2018-10-20 DIAGNOSIS — E210 Primary hyperparathyroidism: Secondary | ICD-10-CM | POA: Diagnosis not present

## 2018-10-20 LAB — BASIC METABOLIC PANEL
Anion gap: 9 (ref 5–15)
BUN: 12 mg/dL (ref 6–20)
CO2: 25 mmol/L (ref 22–32)
Calcium: 8.7 mg/dL — ABNORMAL LOW (ref 8.9–10.3)
Chloride: 103 mmol/L (ref 98–111)
Creatinine, Ser: 0.66 mg/dL (ref 0.44–1.00)
GFR calc Af Amer: >60 mL/min (ref >60)
GFR calc non Af Amer: >60 mL/min (ref >60)
Glucose, Bld: 122 mg/dL — ABNORMAL HIGH (ref 70–99)
Potassium: 4.2 mmol/L (ref 3.5–5.1)
Sodium: 137 mmol/L (ref 135–145)

## 2018-10-20 LAB — CALCIUM: Calcium: 8.4 mg/dL — ABNORMAL LOW (ref 8.9–10.3)

## 2018-10-20 MED ORDER — LEVOTHYROXINE SODIUM 88 MCG PO TABS: 88.0000 ug | ORAL_TABLET | Freq: Every day | ORAL | 3 refills | Status: AC

## 2018-10-20 MED ORDER — TRAMADOL HCL 50 MG PO TABS: 50.0000 mg | ORAL_TABLET | Freq: Four times a day (QID) | ORAL | 0 refills | Status: AC | PRN

## 2018-10-20 NOTE — Progress Notes (Signed)
Pt c/o tingling in fingers and some nubness in chin area.  Dr. Harlow Asa notified and calcium order put in ti epic.

## 2018-10-20 NOTE — Discharge Summary (Signed)
Physician Discharge Summary [facility]  Patient ID: Cynthia Hoover MRN: [id number] DOB/AGE: 55-May-1967 55 y.o.  Admit date: 10/19/2018 Discharge date: 10/20/2018  Admission Diagnoses:  Primary hyperparathyroidism  Discharge Diagnoses:  Active Problems:   Hyperparathyroidism, primary ([place])   Primary hyperparathyroidism ([place])   Discharged Condition: fair  Hospital Course: Patient was admitted for observation following thyroid and parathyroid surgery.  Post op course was uncomplicated.  Pain was well controlled.  Tolerated diet after initial nausea resolved.  Post op calcium level on morning following surgery was 8.7 mg/dl.  Patient was prepared for discharge home on POD#1.  Consults: None  Treatments: surgery: total thyroidectomy, parathyroidectomy  Discharge Exam: Blood pressure 134/83, pulse 71, temperature 98 F (36.7 C), temperature source Oral, resp. rate 12, height 5\' 8"  (1.727 m), weight 87.1 kg, SpO2 100 %. HEENT - clear Neck - wound dry and intact; mild STS; voice soft with no stridor; Dermabond in place Chest - clear bilaterally Cor - RRR  Disposition: Home  Discharge Instructions    Diet - low sodium heart healthy   Complete by: As directed    Discharge instructions   Complete by: As directed    [facility]  THYROID & PARATHYROID SURGERY:  POST-OP INSTRUCTIONS  Always review your discharge instruction sheet from the facility where your surgery was performed.  A prescription for pain medication may be given to you upon discharge.  Take your pain medication as prescribed.  If narcotic pain medicine is not needed, then you may take acetaminophen (Tylenol) or ibuprofen (Advil) as needed.  Take your usually prescribed medications unless otherwise directed.  If you need a refill on your pain medication, please contact our office during regular business hours.  Prescriptions cannot be processed by our office after 5 pm or  on weekends.  Start with a light diet upon arrival home, such as soup and crackers or toast.  Be sure to drink plenty of fluids daily.  Resume your normal diet the day after surgery.  Most patients will experience some swelling and bruising on the chest and neck area.  Ice packs will help.  Swelling and bruising can take several days to resolve.   It is common to experience some constipation after surgery.  Increasing fluid intake and taking a stool softener (Colace) will usually help or prevent this problem.  A mild laxative (Milk of Magnesia or Miralax) should be taken according to package directions if there has been no bowel movement after 48 hours.  You have steri-strips and a gauze dressing over your incision.  You may remove the gauze bandage on the second day after surgery, and you may shower at that time.  Leave your steri-strips (small skin tapes) in place directly over the incision.  These strips should remain on the skin for 5-7 days and then be removed.  You may get them wet in the shower and pat them dry.  You may resume regular (light) daily activities beginning the next day (such as daily self-care, walking, climbing stairs) gradually increasing activities as tolerated.  You may have sexual intercourse when it is comfortable.  Refrain from any heavy lifting or straining until approved by your doctor.  You may drive when you no longer are taking prescription pain medication, you can comfortably wear a seatbelt, and you can safely maneuver your car and apply brakes.  You should see your doctor in the office for a follow-up appointment approximately three weeks after your surgery.  Make sure that you call for this appointment within a day or two after you arrive home to insure a convenient appointment time.  WHEN TO CALL YOUR DOCTOR: -- Fever greater than 101.5 -- Inability to urinate -- Nausea and/or vomiting - persistent -- Extreme swelling or bruising -- Continued bleeding from  incision -- Increased pain, redness, or drainage from the incision -- Difficulty swallowing or breathing -- Muscle cramping or spasms -- Numbness or tingling in hands or around lips  The clinic staff is available to answer your questions during regular business hours.  Please don't hesitate to call and ask to speak to one of the nurses if you have concerns.  [name], MD [facility] Office: [phone]   Increase activity slowly   Complete by: As directed    No dressing needed   Complete by: As directed      Allergies as of 10/20/2018      Reactions   Morphine Other (See Comments)   Hallucinations   Codeine Nausea And Vomiting, Other (See Comments)   Dizzy      Medication List    TAKE these medications   b complex vitamins tablet Take 1 tablet by mouth daily.   baclofen 10 MG tablet Commonly known as: LIORESAL Take 1 tablet (10 mg total) by mouth 3 (three) times daily.   buPROPion 300 MG 24 hr tablet Commonly known as: WELLBUTRIN XL Take 300 mg by mouth daily.   HYDROcodone-acetaminophen 5-325 MG tablet Commonly known as: NORCO/VICODIN Take 1 tablet by mouth every 4 (four) hours as needed for moderate pain.   IRON PO Take 65 mg by mouth daily.   levothyroxine 88 MCG tablet Commonly known as: Synthroid Take 1 tablet (88 mcg total) by mouth daily before breakfast.   methylPREDNISolone 4 MG Tbpk tablet Commonly known as: MEDROL DOSEPAK As directed for 6 days.   naproxen 500 MG tablet Commonly known as: Naprosyn Take 1 tablet (500 mg total) by mouth 2 (two) times daily with a meal.   tiZANidine 2 MG tablet Commonly known as: ZANAFLEX Take 1-2 tablets (2-4 mg total) by mouth every 6 (six) hours as needed for muscle spasms.   traMADol 50 MG tablet Commonly known as: ULTRAM Take 1-2 tablets (50-100 mg total) by mouth every 6 (six) hours as needed. What changed: Another medication with the same name was added. Make sure you understand how  and when to take each.   traMADol 50 MG tablet Commonly known as: ULTRAM Take 1-2 tablets (50-100 mg total) by mouth every 6 (six) hours as needed. What changed: You were already taking a medication with the same name, and this prescription was added. Make sure you understand how and when to take each.   Tylenol 325 MG tablet Generic drug: acetaminophen Take 650 mg by mouth every 6 (six) hours as needed (pain).   VITAMIN B-12 PO Take 5,000 mcg by mouth daily.   Vitamin D (Cholecalciferol) 25 MCG (1000 UT) Tabs Take 1,000 Units by mouth daily.        Earnstine Regal, MD, [facility] Office: [phone]   Signed: [name] 10/20/2018, 10:31 AM

## 2018-10-20 NOTE — Progress Notes (Signed)
Pt stated that the norco is hurting her ulcer and its hard for her to swallow so we've given her dilaudid a few more times.  But nurse told pt that we will give her tramadol to see if that works better.  Has had several pain issues including her pelvis and her back during the night at seemed uncontrollable.

## 2018-10-30 ENCOUNTER — Ambulatory Visit: Payer: Self-pay

## 2018-10-30 ENCOUNTER — Ambulatory Visit (INDEPENDENT_AMBULATORY_CARE_PROVIDER_SITE_OTHER): Payer: 59 | Admitting: Physical Medicine and Rehabilitation

## 2018-10-30 ENCOUNTER — Encounter: Payer: Self-pay | Admitting: Physical Medicine and Rehabilitation

## 2018-10-30 VITALS — BP 147/97 | HR 83

## 2018-10-30 DIAGNOSIS — M5416 Radiculopathy, lumbar region: Secondary | ICD-10-CM

## 2018-10-30 MED ORDER — BETAMETHASONE SOD PHOS & ACET 6 (3-3) MG/ML IJ SUSP
12.0000 mg | Freq: Once | INTRAMUSCULAR | Status: AC
  Administered 2018-10-30: 14:00:00 12 mg

## 2018-10-30 NOTE — Progress Notes (Signed)
 .  Numeric Pain Rating Scale and Functional Assessment Average Pain 9   In the last MONTH (on 0-10 scale) has pain interfered with the following?  1. General activity like being  able to carry out your everyday physical activities such as walking, climbing stairs, carrying groceries, or moving a chair?  Rating(9)   +Driver, -BT, -Dye Allergies.  

## 2018-10-31 NOTE — Procedures (Signed)
Lumbosacral Transforaminal Epidural Steroid Injection - Sub-Pedicular Approach with Fluoroscopic Guidance  Patient: Cynthia Hoover      Date of Birth: May 11, 1965 MRN: [id number] PCP: Kathyrn Lass, MD      Visit Date: 10/30/2018   Universal Protocol:    Date/Time: 10/30/2018  Consent Given By: the patient  Position: PRONE  Additional Comments: Vital signs were monitored before and after the procedure. Patient was prepped and draped in the usual sterile fashion. The correct patient, procedure, and site was verified.   Injection Procedure Details:  Procedure Site One Meds Administered:  Meds ordered this encounter  Medications  . betamethasone acetate-betamethasone sodium phosphate (CELESTONE) injection 12 mg    Laterality: Left  Location/Site:  L5-S1  Needle size: 22 G  Needle type: Spinal  Needle Placement: Transforaminal  Findings:    -Comments: Excellent flow of contrast along the nerve and into the epidural space.  Procedure Details: After squaring off the end-plates to get a true AP view, the C-arm was positioned so that an oblique view of the foramen as noted above was visualized. The target area is just inferior to the "nose of the scotty dog" or sub pedicular. The soft tissues overlying this structure were infiltrated with 2-3 ml. of 1% Lidocaine without Epinephrine.  The spinal needle was inserted toward the target using a "trajectory" view along the fluoroscope beam.  Under AP and lateral visualization, the needle was advanced so it did not puncture dura and was located close the 6 O'Clock position of the pedical in AP tracterory. Biplanar projections were used to confirm position. Aspiration was confirmed to be negative for CSF and/or blood. A 1-2 ml. volume of Isovue-250 was injected and flow of contrast was noted at each level. Radiographs were obtained for documentation purposes.   After attaining the desired flow of contrast documented above, a 0.5 to  1.0 ml test dose of 0.25% Marcaine was injected into each respective transforaminal space.  The patient was observed for 90 seconds post injection.  After no sensory deficits were reported, and normal lower extremity motor function was noted,   the above injectate was administered so that equal amounts of the injectate were placed at each foramen (level) into the transforaminal epidural space.   Additional Comments:  The patient tolerated the procedure well Dressing: 2 x 2 sterile gauze and Band-Aid    Post-procedure details: Patient was observed during the procedure. Post-procedure instructions were reviewed.  Patient left the clinic in stable condition.

## 2018-10-31 NOTE — Progress Notes (Signed)
Cynthia Hoover - 53 y.o. female MRN [id number]  Date of birth: 25-Sep-1965  Office Visit Note: Visit Date: 10/30/2018 PCP: Kathyrn Lass, MD Referred by: Kathyrn Lass, MD  Subjective: Chief Complaint  Patient presents with  . Lower Back - Pain  . Left Leg - Pain   HPI: Cynthia Hoover is a 53 y.o. female who comes in today At the request of Dr. Legrand Como hilts for diagnostic and therapeutic left L5 transforaminal epidural steroid injection.  Patient's been having chronic worsening severe left radicular leg pain and a pretty classic L5 distribution with some paresthesia.  She is failed conservative care with medication management time as well as chiropractic care.  She continues with home exercises.  She has MRI evidence of disc protrusion at L4-5 on the left impacting the lateral recess and likely L5 nerve root.  She has some other disc bulging and discogenic type endplate changes above and below this.  She has had recent history of hyperparathyroidism and is status post partial thyroidectomy and parathyroidectomy.  ROS Otherwise per HPI.  Assessment & Plan: Visit Diagnoses:  1. Lumbar radiculopathy     Plan: No additional findings.   Meds & Orders:  Meds ordered this encounter  Medications  . betamethasone acetate-betamethasone sodium phosphate (CELESTONE) injection 12 mg    Orders Placed This Encounter  Procedures  . XR C-ARM NO REPORT  . Epidural Steroid injection    Follow-up: Return if symptoms worsen or fail to improve.   Procedures: No procedures performed  Lumbosacral Transforaminal Epidural Steroid Injection - Sub-Pedicular Approach with Fluoroscopic Guidance  Patient: Cynthia Hoover      Date of Birth: 05/25/1965 MRN: [id number] PCP: Kathyrn Lass, MD      Visit Date: 10/30/2018   Universal Protocol:    Date/Time: 10/30/2018  Consent Given By: the patient  Position: PRONE  Additional Comments: Vital signs were monitored before and after the  procedure. Patient was prepped and draped in the usual sterile fashion. The correct patient, procedure, and site was verified.   Injection Procedure Details:  Procedure Site One Meds Administered:  Meds ordered this encounter  Medications  . betamethasone acetate-betamethasone sodium phosphate (CELESTONE) injection 12 mg    Laterality: Left  Location/Site:  L5-S1  Needle size: 22 G  Needle type: Spinal  Needle Placement: Transforaminal  Findings:    -Comments: Excellent flow of contrast along the nerve and into the epidural space.  Procedure Details: After squaring off the end-plates to get a true AP view, the C-arm was positioned so that an oblique view of the foramen as noted above was visualized. The target area is just inferior to the "nose of the scotty dog" or sub pedicular. The soft tissues overlying this structure were infiltrated with 2-3 ml. of 1% Lidocaine without Epinephrine.  The spinal needle was inserted toward the target using a "trajectory" view along the fluoroscope beam.  Under AP and lateral visualization, the needle was advanced so it did not puncture dura and was located close the 6 O'Clock position of the pedical in AP tracterory. Biplanar projections were used to confirm position. Aspiration was confirmed to be negative for CSF and/or blood. A 1-2 ml. volume of Isovue-250 was injected and flow of contrast was noted at each level. Radiographs were obtained for documentation purposes.   After attaining the desired flow of contrast documented above, a 0.5 to 1.0 ml test dose of 0.25% Marcaine was injected into each respective transforaminal space.  The patient was observed for 90  seconds post injection.  After no sensory deficits were reported, and normal lower extremity motor function was noted,   the above injectate was administered so that equal amounts of the injectate were placed at each foramen (level) into the transforaminal epidural space.   Additional  Comments:  The patient tolerated the procedure well Dressing: 2 x 2 sterile gauze and Band-Aid    Post-procedure details: Patient was observed during the procedure. Post-procedure instructions were reviewed.  Patient left the clinic in stable condition.    Clinical History: MRI LUMBAR SPINE WITHOUT CONTRAST  TECHNIQUE: Multiplanar, multisequence MR imaging of the lumbar spine was performed. No intravenous contrast was administered.  COMPARISON:  None.  FINDINGS: Segmentation:  Standard.  Alignment:  Physiologic.  Vertebrae: No fracture, evidence of discitis, or bone lesion. Discogenic endplate signal changes are greatest at L3-4.  Conus medullaris and cauda equina: Conus extends to the L1 level. Conus and cauda equina appear normal.  Paraspinal and other soft tissues: Negative.  Disc levels:  T12-L1: Normal.  L1-2: Normal disc.  No herniation or stenosis.  L2-3: Mild disc bulge, left eccentric. Mild left foraminal stenosis.  L3-4: Intermediate sized disc bulge with narrowing of both lateral recesses. Mild bilateral neural foraminal stenosis. No spinal canal stenosis centrally.  L4-5: Intermediate sized left subarticular disc protrusion markedly narrowing the left lateral recess and impinging on the descending left L5 nerve root. Mild central spinal canal and left neural foraminal stenosis.  L5-S1: Intermediate sized disc bulge with endplate spurring. No central spinal canal stenosis. Mild right and severe left neural foraminal stenosis.  IMPRESSION: 1. Intermediate sized L4-5 left subarticular disc protrusion that markedly narrows the left lateral recess and impinges on the descending left L5 nerve root. There is also mild central spinal canal and left neural foraminal stenosis at this level. 2. Severe left L5 neural foraminal stenosis secondary to intermediate sized disc bulge and endplate spurring. 3. Mild bilateral L3 neural foraminal  stenosis.   Electronically Signed   By: Ulyses Jarred M.D.   On: 09/22/2018 03:44   She reports that she has never smoked. She has never used smokeless tobacco. No results for input(s): HGBA1C, LABURIC in the last 8760 hours.  Objective:  VS:  HT:    WT:   BMI:     BP:(!) 147/97  HR:83bpm  TEMP: ( )  RESP:  Physical Exam  Ortho Exam Imaging: Xr C-arm No Report  Result Date: 10/30/2018 Please see Notes tab for imaging impression.   Past Medical/Family/Surgical/Social History: Medications & Allergies reviewed per EMR, new medications updated. Patient Active Problem List   Diagnosis Date Noted  . Primary hyperparathyroidism ([place]) 10/19/2018  . Hyperparathyroidism, primary ([place]) 07/11/2018  . Memory loss 11/15/2017  . Colloid cyst of brain ([place]) 11/15/2017  . Vitamin B12 deficiency 11/15/2017  . Marginal ulcer 10/16/2017  . Open wound of right elbow 02/05/2016   Past Medical History:  Diagnosis Date  . Anemia    hx   . Anxiety   . Compound fracture    L1 , lumbar , required no surgical intervention , just wore a back brace until it resolved   . Cyst of brain    mri of brain q year ses dr Krista Blue yijun nerulogy  . Hyperparathyroidism ([place])   . Hypertension 2008   was on blood pressure medications prior to bariatric surgery , taken off after losing over a 100 pounds,   . Left leg pain    onset after cleaning up the  barn on saturday 08-05-2018; has tried tylenol with some releif, reports it is better today but notices the pain is less wehn she hunches her back forward    . Osteopenia   . Peptic ulcer   . Vision abnormalities    reports blurriness in right eye due to an eye condition , unable to provide name of eye condition   . White coat syndrome without diagnosis of hypertension for years   Family History  Adopted: Yes  Family history unknown: Yes   Past Surgical History:  Procedure Laterality Date  . CESAREAN SECTION  1986  . GASTRIC BYPASS  2008   in  cal[place]ia  . PARATHYROIDECTOMY Right 08/10/2018   Procedure: RIGHT INFERIOR PARATHYROIDECTOMY;  Surgeon: [name], MD;  Location: [facility];  Service: General;  Laterality: Right;  . PARATHYROIDECTOMY N/A 10/19/2018   Procedure: NECK EXPLORATION, TOTAL THYROIDECTOMY, BIOPSY OF LEFT SUPERIOR PARATHYROID; RIGHT SUPERIOR PARATHYROIDECTOMY;  Surgeon: [name], MD;  Location: [facility];  Service: General;  Laterality: N/A;   Social History   Occupational History  . Not on file  Tobacco Use  . Smoking status: Never Smoker  . Smokeless tobacco: Never Used  Substance and Sexual Activity  . Alcohol use: No    Frequency: Never    Comment: seldom   . Drug use: No  . Sexual activity: Yes

## 2020-06-26 IMAGING — NM NM PARATHYROID W/ SPECT
3 series · 18 of 18 positions shown · non-contrast
Comparison: None

CLINICAL DATA: Hypercalcemia.  Concern for hyperthyroidism.

EXAM:
NM PARATHYROID SCINTIGRAPHY AND SPECT IMAGING
TECHNIQUE: Following intravenous administration of radiopharmaceutical, early
and 2-hour delayed planar images were obtained in the anterior
projection. Delayed triplanar SPECT images were also obtained at 2
hours.
RADIOPHARMACEUTICALS:  20.6 mCi Nc-33m Sestamibi IV

[Series 1: spect - (id)_(id)_cor · 4.1mm · 4.14mm/px · 6 of 128 frames shown]
[frame 11/128]
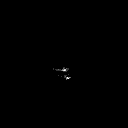
[frame 32/128]
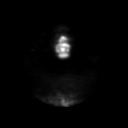
[frame 54/128]
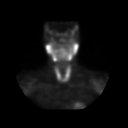
[frame 75/128]
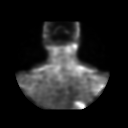
[frame 96/128]
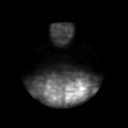
[frame 118/128]
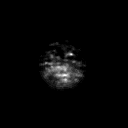

[Series 1: spect - (id)_(id)_tra · 4.1mm · 4.14mm/px · 6 of 128 frames shown]
[frame 11/128]
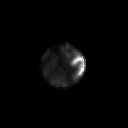
[frame 32/128]
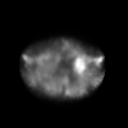
[frame 54/128]
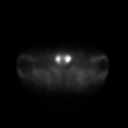
[frame 75/128]
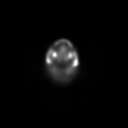
[frame 96/128]
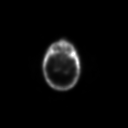
[frame 118/128]
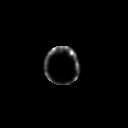

[Series 3: spect parathyroid · 4.14mm/px · 6 of 64 frames shown]
[frame 6/64  full-range]
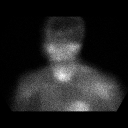
[frame 16/64  full-range]
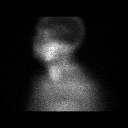
[frame 27/64  full-range]
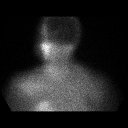
[frame 38/64  full-range]
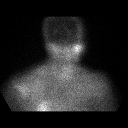
[frame 48/64  full-range]
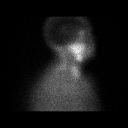
[frame 59/64  full-range]
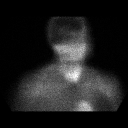

[18 of 18 positions shown; findings below may reference images not displayed]

FINDINGS: There is retention of radiotracer within the thyroid gland at 2
hours greater than normal. With that caveat, there is no focal
activity within the thyroid bed above background thyroid tissue
level to localizes parathyroid
IMPRESSION: No evidence of parathyroid adenoma.
